# Patient Record
Sex: Female | Born: 1970 | Race: Black or African American | Hispanic: No | Marital: Single | Smoking: Never smoker
Health system: Southern US, Community
[De-identification: ages and names within clinical notes are randomized; demographics above are authoritative.]

## PROBLEM LIST (undated history)

## (undated) HISTORY — PX: ANKLE FRACTURE SURGERY: SHX122

## (undated) HISTORY — PX: TUBAL LIGATION: SHX77

## (undated) HISTORY — PX: CHOLECYSTECTOMY: SHX55

---

## 2016-01-24 ENCOUNTER — Encounter (HOSPITAL_BASED_OUTPATIENT_CLINIC_OR_DEPARTMENT_OTHER): Payer: Self-pay | Admitting: *Deleted

## 2016-01-24 ENCOUNTER — Emergency Department (HOSPITAL_BASED_OUTPATIENT_CLINIC_OR_DEPARTMENT_OTHER)
Admission: EM | Admit: 2016-01-24 | Discharge: 2016-01-24 | Disposition: A | Discharge status: Home / Self Care | Payer: 59 | Attending: Emergency Medicine | Admitting: Emergency Medicine

## 2016-01-24 DIAGNOSIS — S3992XA Unspecified injury of lower back, initial encounter: Secondary | ICD-10-CM | POA: Diagnosis present

## 2016-01-24 DIAGNOSIS — S39012A Strain of muscle, fascia and tendon of lower back, initial encounter: Secondary | ICD-10-CM | POA: Diagnosis not present

## 2016-01-24 DIAGNOSIS — W010XXA Fall on same level from slipping, tripping and stumbling without subsequent striking against object, initial encounter: Secondary | ICD-10-CM | POA: Diagnosis not present

## 2016-01-24 DIAGNOSIS — Y999 Unspecified external cause status: Secondary | ICD-10-CM | POA: Diagnosis not present

## 2016-01-24 DIAGNOSIS — Y929 Unspecified place or not applicable: Secondary | ICD-10-CM | POA: Diagnosis not present

## 2016-01-24 DIAGNOSIS — Y939 Activity, unspecified: Secondary | ICD-10-CM | POA: Diagnosis not present

## 2016-01-24 DIAGNOSIS — W19XXXA Unspecified fall, initial encounter: Secondary | ICD-10-CM

## 2016-01-24 MED ORDER — NAPROXEN 500 MG PO TABS: 500.0000 mg | ORAL_TABLET | Freq: Two times a day (BID) | ORAL | Status: DC

## 2016-01-24 MED ORDER — ORPHENADRINE CITRATE ER 100 MG PO TB12: 100.0000 mg | ORAL_TABLET | Freq: Two times a day (BID) | ORAL | Status: DC
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
## 2016-01-24 NOTE — ED Provider Notes (Signed)
CSN: [id number]     Arrival date & time 01/24/16  0748 History   First MD Initiated Contact with Patient 01/24/16 0801     No chief complaint on file.    (Consider location/radiation/quality/duration/timing/severity/associated sxs/prior Treatment) HPI Patient fell at work on Thursday, 3 days ago. She states she slipped and fell backwards landing on her buttocks and then her back. She reports that at that time, the pain was not too severe. She reports however starting yesterday and today her back is much more painful and stiff. Pain is worse with trying to bend forward. Twisting motions also or uncomfortable. Pain is from the lower back in the center and radiating greater to the left but also slightly to the right. No lower extremity pain, weakness or dysfunction. No radiation of pain into the abdomen or lower extremities. No urinary dysfunction. No pain burning or urgency. No gait dysfunction.  No past medical history on file. No past surgical history on file. No family history on file. Social History  Substance Use Topics  . Smoking status: Not on file  . Smokeless tobacco: Not on file  . Alcohol Use: Not on file   OB History    No data available     Review of Systems  10 Systems reviewed and are negative for acute change except as noted in the HPI.   Allergies  Review of patient's allergies indicates not on file.  Home Medications   Prior to Admission medications   Medication Sig Start Date End Date Taking? Authorizing Provider  naproxen (NAPROSYN) 500 MG tablet Take 1 tablet (500 mg total) by mouth 2 (two) times daily. 01/24/16   Charlesetta Shanks, MD  orphenadrine (NORFLEX) 100 MG tablet Take 1 tablet (100 mg total) by mouth 2 (two) times daily. 01/24/16   Charlesetta Shanks, MD   BP 124/81 mmHg  Pulse 52  Temp(Src) 99.1 F (37.3 C) (Oral)  Resp 20  Ht 5\' 6"  (1.676 m)  Wt 181 lb (82.101 kg)  BMI 29.23 kg/m2  SpO2 100% Physical Exam  Constitutional: She is oriented to person,  place, and time. She appears well-developed and well-nourished.  HENT:  Head: Normocephalic and atraumatic.  Eyes: EOM are normal. Pupils are equal, round, and reactive to light.  Neck: Neck supple.  Cardiovascular: Normal rate, regular rhythm, normal heart sounds and intact distal pulses.   Pulmonary/Chest: Effort normal and breath sounds normal.  Abdominal: Soft. Bowel sounds are normal. She exhibits no distension. There is no tenderness.  Musculoskeletal: Normal range of motion. She exhibits no edema.  Tenderness to palpation of the lumbar spine from approximately L3 to the sacrum. Palpable deformity. Palpation tenderness also to the left SI region and paraspinous muscle bodies. Normal lower extremity examination. Strength testing 5 out of 5 flexion and extension at the ankles. Patient is able to spontaneously elevate each lower shot off the bed and hold. The station intact to light touch.  Neurological: She is alert and oriented to person, place, and time. She has normal strength. She exhibits normal muscle tone. Coordination normal. GCS eye subscore is 4. GCS verbal subscore is 5. GCS motor subscore is 6.  Skin: Skin is warm, dry and intact.  Psychiatric: She has a normal mood and affect.    ED Course  Procedures (including critical care time) Labs Review Labs Reviewed - No data to display  Imaging Review No results found. I have personally reviewed and evaluated these images and lab results as part of my medical decision-making.  EKG Interpretation None      MDM   Final diagnoses:  Lumbosacral strain, initial encounter  Fall, initial encounter   Findings consistent with lumbosacral strain without neurologic compromise. Patient will be initiated on naproxen and Norflex. She is counseled on follow-up plan with sports medicine.    Charlesetta Shanks, MD 01/24/16 [phone]

## 2016-01-24 NOTE — Discharge Instructions (Signed)

## 2017-09-25 ENCOUNTER — Encounter (HOSPITAL_BASED_OUTPATIENT_CLINIC_OR_DEPARTMENT_OTHER): Payer: Self-pay | Admitting: Emergency Medicine

## 2017-09-25 ENCOUNTER — Emergency Department (HOSPITAL_BASED_OUTPATIENT_CLINIC_OR_DEPARTMENT_OTHER)
Admission: EM | Admit: 2017-09-25 | Discharge: 2017-09-25 | Disposition: A | Discharge status: Home / Self Care | Payer: Self-pay | Attending: Emergency Medicine | Admitting: Emergency Medicine

## 2017-09-25 ENCOUNTER — Emergency Department (HOSPITAL_BASED_OUTPATIENT_CLINIC_OR_DEPARTMENT_OTHER): Payer: Self-pay

## 2017-09-25 ENCOUNTER — Other Ambulatory Visit: Payer: Self-pay

## 2017-09-25 DIAGNOSIS — R911 Solitary pulmonary nodule: Secondary | ICD-10-CM | POA: Insufficient documentation

## 2017-09-25 DIAGNOSIS — R112 Nausea with vomiting, unspecified: Secondary | ICD-10-CM | POA: Insufficient documentation

## 2017-09-25 DIAGNOSIS — Z7722 Contact with and (suspected) exposure to environmental tobacco smoke (acute) (chronic): Secondary | ICD-10-CM | POA: Insufficient documentation

## 2017-09-25 DIAGNOSIS — R059 Cough, unspecified: Secondary | ICD-10-CM

## 2017-09-25 DIAGNOSIS — B349 Viral infection, unspecified: Secondary | ICD-10-CM | POA: Insufficient documentation

## 2017-09-25 DIAGNOSIS — Z79899 Other long term (current) drug therapy: Secondary | ICD-10-CM | POA: Insufficient documentation

## 2017-09-25 DIAGNOSIS — R05 Cough: Secondary | ICD-10-CM

## 2017-09-25 MED ORDER — ONDANSETRON HCL 4 MG PO TABS: 4.0000 mg | ORAL_TABLET | Freq: Three times a day (TID) | ORAL | 0 refills | Status: DC | PRN

## 2017-09-25 MED ORDER — BENZONATATE 100 MG PO CAPS: 100.0000 mg | ORAL_CAPSULE | Freq: Three times a day (TID) | ORAL | 0 refills | Status: DC

## 2017-09-25 MED ORDER — SODIUM CHLORIDE 0.9 % IV BOLUS (SEPSIS)
1000.0000 mL | Freq: Once | INTRAVENOUS | Status: AC
  Administered 2017-09-25: 1000 mL via INTRAVENOUS

## 2017-09-25 MED ORDER — KETOROLAC TROMETHAMINE 30 MG/ML IJ SOLN
30.0000 mg | Freq: Once | INTRAMUSCULAR | Status: AC
  Administered 2017-09-25: 30 mg via INTRAVENOUS
  Filled 2017-09-25: qty 1

## 2017-09-25 MED ORDER — ONDANSETRON HCL 4 MG/2ML IJ SOLN
4.0000 mg | Freq: Once | INTRAMUSCULAR | Status: AC
  Administered 2017-09-25: 4 mg via INTRAVENOUS
  Filled 2017-09-25: qty 2

## 2017-09-25 MED ORDER — IBUPROFEN 600 MG PO TABS: 600.0000 mg | ORAL_TABLET | Freq: Four times a day (QID) | ORAL | 0 refills | Status: DC | PRN

## 2017-09-25 NOTE — ED Triage Notes (Signed)
"   Every joint hurts since 1/18 because all my grand kids are sick with colds"

## 2017-09-25 NOTE — Discharge Instructions (Signed)
You were seen today for upper respiratory symptoms, cough, vomiting.  This is likely viral in nature.  You are not a candidate for Tamiflu.  Make sure you are staying hydrated.  Take ibuprofen as needed for body aches or pains.  Zofran for nausea.  Tessalon Perles for cough.  On your x-ray, you were seen to have a persistent pulmonary nodule.  Given that you are non-smoker, this may be benign.  You need to have an outpatient CT scan to better characterize.

## 2017-09-25 NOTE — ED Notes (Signed)
Given gingerale for fluid challenge  

## 2017-09-25 NOTE — ED Provider Notes (Signed)
[facility] Provider Note   CSN: [id number] Arrival date & time: 09/25/17  0545     History   Chief Complaint Chief Complaint  Patient presents with  . Cough    HPI Gina Mason is a 47 y.o. female.  HPI  This a 47 year old female with no significant past medical history who presents with 1 week history of upper respiratory symptoms.  Patient reports a nonproductive cough.  She reports chills without documented fevers.  She reports generalized myalgias.  Over the last 2 days she has had vomiting.  Denies abdominal pain or chest pain.  Denies significant shortness of breath.  She is a non-smoker.  Her son is sick with similar symptoms.  She did not receive a flu vaccine this year.  History reviewed. No pertinent past medical history.  There are no active problems to display for this patient.   Past Surgical History:  Procedure Laterality Date  . ANKLE FRACTURE SURGERY    . CHOLECYSTECTOMY      OB History    No data available       Home Medications    Prior to Admission medications   Medication Sig Start Date End Date Taking? Authorizing Provider  benzonatate (TESSALON) 100 MG capsule Take 1 capsule (100 mg total) by mouth every 8 (eight) hours. 09/25/17   [name], [name], MD  ibuprofen (ADVIL,MOTRIN) 600 MG tablet Take 1 tablet (600 mg total) by mouth every 6 (six) hours as needed. 09/25/17   [name], [name], MD  naproxen (NAPROSYN) 500 MG tablet Take 1 tablet (500 mg total) by mouth 2 (two) times daily. 01/24/16   Charlesetta Shanks, MD  ondansetron (ZOFRAN) 4 MG tablet Take 1 tablet (4 mg total) by mouth every 8 (eight) hours as needed for nausea or vomiting. 09/25/17   [name], [name], MD  orphenadrine (NORFLEX) 100 MG tablet Take 1 tablet (100 mg total) by mouth 2 (two) times daily. 01/24/16   Charlesetta Shanks, MD    Family History No family history on file.  Social History Social History   Tobacco Use  . Smoking status: Passive  Smoke Exposure - Never Smoker  . Smokeless tobacco: Never Used  Substance Use Topics  . Alcohol use: No    Alcohol/week: 1.2 oz    Types: 2 Cans of beer per week    Frequency: Never  . Drug use: No     Allergies   Patient has no known allergies.   Review of Systems Review of Systems  Constitutional: Positive for chills. Negative for fever.  HENT: Negative for sore throat.   Respiratory: Positive for cough. Negative for shortness of breath.   Cardiovascular: Negative for chest pain.  Gastrointestinal: Positive for nausea and vomiting. Negative for abdominal pain and diarrhea.  Musculoskeletal: Positive for myalgias.  Neurological: Negative for headaches.  All other systems reviewed and are negative.    Physical Exam Updated Vital Signs BP 109/64 (BP Location: Right Arm)   Pulse 68   Temp 98.7 F (37.1 C) (Oral)   Resp 16   Ht 5\' 6"  (1.676 m)   Wt 93 kg (205 lb)   LMP 09/19/2017 (Exact Date)   SpO2 99%   BMI 33.09 kg/m   Physical Exam  Constitutional: She is oriented to person, place, and time. She appears well-developed and well-nourished.  Obese, nontoxic-appearing, no acute distress  HENT:  Head: Normocephalic and atraumatic.  Mouth/Throat: Oropharynx is clear and moist. No oropharyngeal exudate.  Mucous membrane  is dry  Eyes: Pupils are equal, round, and reactive to light.  Cardiovascular: Normal rate, regular rhythm and normal heart sounds.  Pulmonary/Chest: Effort normal. No respiratory distress. She has no wheezes.  Abdominal: Soft. Bowel sounds are normal. She exhibits no mass. There is no tenderness.  Lymphadenopathy:    She has no cervical adenopathy.  Neurological: She is alert and oriented to person, place, and time.  Skin: Skin is warm and dry.  Psychiatric: She has a normal mood and affect.  Nursing note and vitals reviewed.    ED Treatments / Results  Labs (all labs ordered are listed, but only abnormal results are displayed) Labs Reviewed  - No data to display  EKG  EKG Interpretation None       Radiology Dg Chest 2 View  Result Date: 09/25/2017 CLINICAL DATA:  Cough congestion and cold like symptoms. EXAM: CHEST  2 VIEW COMPARISON:  11/04/2009 FINDINGS: Cardiomediastinal silhouette is normal. Mediastinal contours appear intact. There is no evidence of pleural effusion or pneumothorax. There is a persistent 2.1 cm post infectious scarring versus pulmonary nodule in the subpleural left upper lobe, at the site of previously seen airspace consolidation, on chest x-ray dated 11/04/2009. Osseous structures are without acute abnormality. Soft tissues are grossly normal. IMPRESSION: No evidence of lobar pneumonia. Persistent 2.1 cm subpleural scarring versus pulmonary nodule in the left upper lobe. Further evaluation with chest CT with contrast may be considered to further characterize this finding. Electronically Signed   By: Fidela Salisbury M.D.   On: 09/25/2017 07:11    Procedures Procedures (including critical care time)  Medications Ordered in ED Medications  sodium chloride 0.9 % bolus 1,000 mL (1,000 mLs Intravenous New Bag/Given 09/25/17 0726)  ondansetron (ZOFRAN) injection 4 mg (4 mg Intravenous Given 09/25/17 0726)  ketorolac (TORADOL) 30 MG/ML injection 30 mg (30 mg Intravenous Given 09/25/17 0726)     Initial Impression / Assessment and Plan / ED Course  I have reviewed the triage vital signs and the nursing notes.  Pertinent labs & imaging results that were available during my care of the patient were reviewed by me and considered in my medical decision making (see chart for details).     Patient presents with upper respiratory and viral symptoms.  She is overall nontoxic appearing.  Afebrile.  Vital signs reassuring.  Exam is fairly benign.  She does appear a Mcneill bit dry.  Patient was given fluids, Zofran, ibuprofen.  Chest x-ray shows no evidence of pneumonia.  Suspect viral etiology.  Recommend supportive  measures at home.  After history, exam, and medical workup I feel the patient has been appropriately medically screened and is safe for discharge home. Pertinent diagnoses were discussed with the patient. Patient was given return precautions.   Final Clinical Impressions(s) / ED Diagnoses   Final diagnoses:  Viral syndrome  Cough  Non-intractable vomiting with nausea, unspecified vomiting type  Pulmonary nodule    ED Discharge Orders        Ordered    benzonatate (TESSALON) 100 MG capsule  Every 8 hours     09/25/17 0744    ondansetron (ZOFRAN) 4 MG tablet  Every 8 hours PRN     09/25/17 0744    ibuprofen (ADVIL,MOTRIN) 600 MG tablet  Every 6 hours PRN     09/25/17 0744       Merryl Hacker, MD 09/25/17 [phone]

## 2017-10-02 ENCOUNTER — Encounter (HOSPITAL_BASED_OUTPATIENT_CLINIC_OR_DEPARTMENT_OTHER): Payer: Self-pay | Admitting: Emergency Medicine

## 2017-10-02 ENCOUNTER — Other Ambulatory Visit: Payer: Self-pay

## 2017-10-02 ENCOUNTER — Emergency Department (HOSPITAL_BASED_OUTPATIENT_CLINIC_OR_DEPARTMENT_OTHER): Payer: Self-pay

## 2017-10-02 ENCOUNTER — Emergency Department (HOSPITAL_BASED_OUTPATIENT_CLINIC_OR_DEPARTMENT_OTHER)
Admission: EM | Admit: 2017-10-02 | Discharge: 2017-10-02 | Disposition: A | Discharge status: Home / Self Care | Payer: Self-pay | Attending: Emergency Medicine | Admitting: Emergency Medicine

## 2017-10-02 DIAGNOSIS — R69 Illness, unspecified: Secondary | ICD-10-CM

## 2017-10-02 DIAGNOSIS — Z7722 Contact with and (suspected) exposure to environmental tobacco smoke (acute) (chronic): Secondary | ICD-10-CM | POA: Insufficient documentation

## 2017-10-02 DIAGNOSIS — J111 Influenza due to unidentified influenza virus with other respiratory manifestations: Secondary | ICD-10-CM | POA: Insufficient documentation

## 2017-10-02 DIAGNOSIS — Z79899 Other long term (current) drug therapy: Secondary | ICD-10-CM | POA: Insufficient documentation

## 2017-10-02 MED ORDER — ACETAMINOPHEN 500 MG PO TABS
1000.0000 mg | ORAL_TABLET | Freq: Once | ORAL | Status: AC
  Administered 2017-10-02: 1000 mg via ORAL
  Filled 2017-10-02: qty 2

## 2017-10-02 NOTE — ED Notes (Signed)
Patient transported to X-ray 

## 2017-10-02 NOTE — ED Provider Notes (Signed)
[facility] Provider Note   CSN: [id number] Arrival date & time: 10/02/17  1015     History   Chief Complaint Chief Complaint  Patient presents with  . Headache  . Cough    HPI Gina Mason is a 47 y.o. female.  Complains of headache and cough for 1 week.  She has had a few episodes of posttussive vomiting.  She also reports chest pain which started while waiting here, points to a 2 cm area at subxiphoid area which is nonradiating which is not made better or worse by anything.  Associated symptoms include up a few episodes of posttussive vomiting.  She is was seen here on September 25, 2017 for same complaint diagnosed with viral syndrome prescribed ibuprofen, Zofran and Tessalon  which she is taken without relief.  Eyes fever.  HPI  History reviewed. No pertinent past medical history. Past medical history negative There are no active problems to display for this patient.   Past Surgical History:  Procedure Laterality Date  . ANKLE FRACTURE SURGERY    . CHOLECYSTECTOMY      OB History    No data available       Home Medications    Prior to Admission medications   Medication Sig Start Date End Date Taking? Authorizing Provider  benzonatate (TESSALON) 100 MG capsule Take 1 capsule (100 mg total) by mouth every 8 (eight) hours. 09/25/17  Yes Horton, [name], MD  ondansetron (ZOFRAN) 4 MG tablet Take 1 tablet (4 mg total) by mouth every 8 (eight) hours as needed for nausea or vomiting. 09/25/17  Yes Horton, [name], MD  ibuprofen (ADVIL,MOTRIN) 600 MG tablet Take 1 tablet (600 mg total) by mouth every 6 (six) hours as needed. 09/25/17   Horton, [name], MD  naproxen (NAPROSYN) 500 MG tablet Take 1 tablet (500 mg total) by mouth 2 (two) times daily. 01/24/16   Charlesetta Shanks, MD  orphenadrine (NORFLEX) 100 MG tablet Take 1 tablet (100 mg total) by mouth 2 (two) times daily. 01/24/16   Charlesetta Shanks, MD    Family History No family history on  file. No family history of coronary disease Social History Social History   Tobacco Use  . Smoking status: Passive Smoke Exposure - Never Smoker  . Smokeless tobacco: Never Used  Substance Use Topics  . Alcohol use: No    Alcohol/week: 1.2 oz    Types: 2 Cans of beer per week    Frequency: Never  . Drug use: No     Allergies   Patient has no known allergies.   Review of Systems Review of Systems  Constitutional: Negative.   HENT: Negative.   Respiratory: Negative.   Cardiovascular: Positive for chest pain.  Gastrointestinal: Positive for vomiting.       Posttussive vomiting  Musculoskeletal: Negative.   Skin: Negative.   Neurological: Positive for headaches.  Psychiatric/Behavioral: Negative.   All other systems reviewed and are negative.    Physical Exam Updated Vital Signs BP 122/90   Pulse 60   Temp 98.1 F (36.7 C) (Oral)   Resp 18   Ht 5\' 6"  (1.676 m)   Wt 91.6 kg (202 lb)   LMP 09/19/2017 (Exact Date)   SpO2 100%   BMI 32.60 kg/m   Physical Exam  Constitutional: She appears well-developed and well-nourished.  HENT:  Head: Normocephalic and atraumatic.  Eyes: Conjunctivae are normal. Pupils are equal, round, and reactive to light.  Neck: Neck supple. No  tracheal deviation present. No thyromegaly present.  Cardiovascular: Normal rate, regular rhythm and normal heart sounds.  No murmur heard. Pulmonary/Chest: Effort normal and breath sounds normal.  Abdominal: Soft. Bowel sounds are normal. She exhibits no distension. There is no tenderness.  Musculoskeletal: Normal range of motion. She exhibits no edema or tenderness.  Neurological: She is alert. Coordination normal.  Skin: Skin is warm and dry. No rash noted.  Psychiatric: She has a normal mood and affect.  Nursing note and vitals reviewed.    ED Treatments / Results  Labs (all labs ordered are listed, but only abnormal results are displayed) Labs Reviewed - No data to display  EKG  EKG  Interpretation  Date/Time:  Sunday October 02 2017 13:06:24 EST Ventricular Rate:  53 PR Interval:    QRS Duration: 114 QT Interval:  436 QTC Calculation: 410 R Axis:   101 Text Interpretation:  Sinus rhythm IRBBB and LPFB Low voltage, precordial leads No old tracing to compare Confirmed by Orlie Dakin [phone]) on 10/02/2017 1:16:10 PM Also confirmed by Orlie Dakin [phone]), editor Philomena Doheny [phone])  on 10/02/2017 1:26:36 PM       Radiology No results found. Chest x-ray viewed by me No results found for this or any previous visit. Dg Chest 2 View  Result Date: 10/02/2017 CLINICAL DATA:  Cough for 2 weeks. EXAM: CHEST  2 VIEW COMPARISON:  09/25/2017 and prior exam FINDINGS: Mild cardiomegaly and peribronchial thickening again noted. Left apical scarring is again noted. There is no evidence of focal airspace disease, pulmonary edema, suspicious pulmonary nodule/mass, pleural effusion, or pneumothorax. No acute bony abnormalities are identified. IMPRESSION: No evidence of acute cardiopulmonary disease. Electronically Signed   By: Margarette Canada M.D.   On: 10/02/2017 13:16   Dg Chest 2 View  Result Date: 09/25/2017 CLINICAL DATA:  Cough congestion and cold like symptoms. EXAM: CHEST  2 VIEW COMPARISON:  11/04/2009 FINDINGS: Cardiomediastinal silhouette is normal. Mediastinal contours appear intact. There is no evidence of pleural effusion or pneumothorax. There is a persistent 2.1 cm post infectious scarring versus pulmonary nodule in the subpleural left upper lobe, at the site of previously seen airspace consolidation, on chest x-ray dated 11/04/2009. Osseous structures are without acute abnormality. Soft tissues are grossly normal. IMPRESSION: No evidence of lobar pneumonia. Persistent 2.1 cm subpleural scarring versus pulmonary nodule in the left upper lobe. Further evaluation with chest CT with contrast may be considered to further characterize this finding. Electronically Signed   By:  Fidela Salisbury M.D.   On: 09/25/2017 07:11   Procedures Procedures (including critical care time)  Medications Ordered in ED Medications  acetaminophen (TYLENOL) tablet 1,000 mg (not administered)     Initial Impression / Assessment and Plan / ED Course  I have reviewed the triage vital signs and the nursing notes.  Pertinent labs & imaging results that were available during my care of the patient were reviewed by me and considered in my medical decision making (see chart for details).     1:30 PM patient is chest pain-free.  Feels improved after treatment with Tylenol. On further history she reports similar chest pain to today's at subxiphoid area intermittently for a year.  Pain is nonexertional.  I do not feel she needs further ED workup for this atypical sounding chest pain.  She has no risk factors for coronary disease she does need referral to primary care.   Plan Tylenol encourage oral hydration.  Referral primary care Final Clinical Impressions(s) / ED  Diagnoses  Diagnoses #1 influenza-like illness #2 atypical chest pain #3 headache Final diagnoses:  None    ED Discharge Orders    None       Orlie Dakin, MD 10/02/17 1339

## 2017-10-02 NOTE — ED Notes (Signed)
Pt on monitor 

## 2017-10-02 NOTE — Discharge Instructions (Signed)
Take Tylenol every 4 hours as needed for aches.Make sure that you drink at least six 8 ounce glasses of water or Gatorade each day in order to stay well-hydrated.  Call the number on these instructions tomorrow to arrange to get a primary care physician.  You may need further investigation of your chest pain as an outpatient.  Your EKG(heart tracing) was slightly abnormal today, but not suggestive of a heart attack.  Return if concern for any reason

## 2017-10-02 NOTE — ED Triage Notes (Signed)
Pt reports headache x 1 week. Pt has been coughing x 4 days and has pain in her chest when she coughs.

## 2019-04-28 ENCOUNTER — Other Ambulatory Visit: Payer: Self-pay

## 2019-04-28 ENCOUNTER — Encounter (HOSPITAL_BASED_OUTPATIENT_CLINIC_OR_DEPARTMENT_OTHER): Payer: Self-pay | Admitting: Emergency Medicine

## 2019-04-28 ENCOUNTER — Emergency Department (HOSPITAL_BASED_OUTPATIENT_CLINIC_OR_DEPARTMENT_OTHER)
Admission: EM | Admit: 2019-04-28 | Discharge: 2019-04-28 | Disposition: A | Discharge status: Home / Self Care | Payer: Self-pay | Attending: Emergency Medicine | Admitting: Emergency Medicine

## 2019-04-28 DIAGNOSIS — R102 Pelvic and perineal pain: Secondary | ICD-10-CM | POA: Insufficient documentation

## 2019-04-28 DIAGNOSIS — Z79899 Other long term (current) drug therapy: Secondary | ICD-10-CM | POA: Insufficient documentation

## 2019-04-28 DIAGNOSIS — Z7722 Contact with and (suspected) exposure to environmental tobacco smoke (acute) (chronic): Secondary | ICD-10-CM | POA: Insufficient documentation

## 2019-04-28 LAB — CBC WITH DIFFERENTIAL/PLATELET
Abs Immature Granulocytes: 0.01 10*3/uL (ref 0.00–0.07)
Basophils Absolute: 0 10*3/uL (ref 0.0–0.1)
Basophils Relative: 0 %
Eosinophils Absolute: 0.3 10*3/uL (ref 0.0–0.5)
Eosinophils Relative: 4 %
HCT: 47.7 % — ABNORMAL HIGH (ref 36.0–46.0)
Hemoglobin: 15.3 g/dL — ABNORMAL HIGH (ref 12.0–15.0)
Immature Granulocytes: 0 %
Lymphocytes Relative: 27 %
Lymphs Abs: 1.9 10*3/uL (ref 0.7–4.0)
MCH: 28.8 pg (ref 26.0–34.0)
MCHC: 32.1 g/dL (ref 30.0–36.0)
MCV: 89.7 fL (ref 80.0–100.0)
Monocytes Absolute: 0.5 10*3/uL (ref 0.1–1.0)
Monocytes Relative: 7 %
Neutro Abs: 4.4 10*3/uL (ref 1.7–7.7)
Neutrophils Relative %: 62 %
Platelets: 167 10*3/uL (ref 150–400)
RBC: 5.32 MIL/uL — ABNORMAL HIGH (ref 3.87–5.11)
RDW: 12.6 % (ref 11.5–15.5)
WBC: 7.1 10*3/uL (ref 4.0–10.5)
nRBC: 0 % (ref 0.0–0.2)

## 2019-04-28 LAB — URINALYSIS, MICROSCOPIC (REFLEX)

## 2019-04-28 LAB — COMPREHENSIVE METABOLIC PANEL
ALT: 26 U/L (ref 0–44)
AST: 23 U/L (ref 15–41)
Albumin: 4 g/dL (ref 3.5–5.0)
Alkaline Phosphatase: 77 U/L (ref 38–126)
Anion gap: 11 (ref 5–15)
BUN: 10 mg/dL (ref 6–20)
CO2: 25 mmol/L (ref 22–32)
Calcium: 9.1 mg/dL (ref 8.9–10.3)
Chloride: 105 mmol/L (ref 98–111)
Creatinine, Ser: 0.7 mg/dL (ref 0.44–1.00)
GFR calc Af Amer: >60 mL/min (ref >60)
GFR calc non Af Amer: >60 mL/min (ref >60)
Glucose, Bld: 105 mg/dL — ABNORMAL HIGH (ref 70–99)
Potassium: 3.9 mmol/L (ref 3.5–5.1)
Sodium: 141 mmol/L (ref 135–145)
Total Bilirubin: 0.8 mg/dL (ref 0.3–1.2)
Total Protein: 7.7 g/dL (ref 6.5–8.1)

## 2019-04-28 LAB — URINALYSIS, ROUTINE W REFLEX MICROSCOPIC
Bilirubin Urine: NEGATIVE
Glucose, UA: NEGATIVE mg/dL
Ketones, ur: NEGATIVE mg/dL
Leukocytes,Ua: NEGATIVE
Nitrite: NEGATIVE
Protein, ur: NEGATIVE mg/dL
Specific Gravity, Urine: >1.03 — ABNORMAL HIGH (ref 1.005–1.030)
pH: 5.5 (ref 5.0–8.0)

## 2019-04-28 LAB — PREGNANCY, URINE: Preg Test, Ur: NEGATIVE

## 2019-04-28 MED ORDER — CARISOPRODOL 350 MG PO TABS: 350.0000 mg | ORAL_TABLET | Freq: Three times a day (TID) | ORAL | 0 refills | Status: AC

## 2019-04-28 MED ORDER — IBUPROFEN 600 MG PO TABS: 600.0000 mg | ORAL_TABLET | Freq: Four times a day (QID) | ORAL | 0 refills | Status: DC | PRN

## 2019-04-28 NOTE — ED Triage Notes (Signed)
Ongoing lower abd pain for several weeks. Dx with uterine fibroids. She states she can't find anyone to do surgery.

## 2019-04-28 NOTE — ED Provider Notes (Signed)
[facility] Provider Note   CSN: [id number] Arrival date & time: 04/28/19  [id number]     History   Chief Complaint Chief Complaint  Patient presents with  . Abdominal Pain    HPI Gina Mason is a 48 y.o. female.     HPI Patient states she has known multiple uterine fibroids.  She has irregular periods that are frequently heavy.  Just chronic pelvic pain associated with this.  She is having difficulty being seen by a OB/GYN for possible hysterectomy.  She is currently applying for Medicaid.  She denies any fever or chills.  No nausea or vomiting.  Normal bowel movements.  Denies urinary symptoms.  Complains of chronic ongoing pain.  No current vaginal bleeding.   History reviewed. No pertinent past medical history.  There are no active problems to display for this patient.   Past Surgical History:  Procedure Laterality Date  . ANKLE FRACTURE SURGERY    . CHOLECYSTECTOMY    . TUBAL LIGATION       OB History   No obstetric history on file.      Home Medications    Prior to Admission medications   Medication Sig Start Date End Date Taking? Authorizing Provider  benzonatate (TESSALON) 100 MG capsule Take 1 capsule (100 mg total) by mouth every 8 (eight) hours. 09/25/17   Horton, [name], MD  carisoprodol (SOMA) 350 MG tablet Take 1 tablet (350 mg total) by mouth 3 (three) times daily. 04/28/19   Julianne Rice, MD  ibuprofen (ADVIL) 600 MG tablet Take 1 tablet (600 mg total) by mouth every 6 (six) hours as needed for moderate pain. 04/28/19   Julianne Rice, MD  naproxen (NAPROSYN) 500 MG tablet Take 1 tablet (500 mg total) by mouth 2 (two) times daily. 01/24/16   Charlesetta Shanks, MD  ondansetron (ZOFRAN) 4 MG tablet Take 1 tablet (4 mg total) by mouth every 8 (eight) hours as needed for nausea or vomiting. 09/25/17   Horton, [name], MD  orphenadrine (NORFLEX) 100 MG tablet Take 1 tablet (100 mg total) by mouth 2 (two) times daily. 01/24/16    Charlesetta Shanks, MD    Family History No family history on file.  Social History Social History   Tobacco Use  . Smoking status: Passive Smoke Exposure - Never Smoker  . Smokeless tobacco: Never Used  Substance Use Topics  . Alcohol use: Yes    Alcohol/week: 2.0 standard drinks    Types: 2 Cans of beer per week    Frequency: Never  . Drug use: No     Allergies   Patient has no known allergies.   Review of Systems Review of Systems  Constitutional: Positive for fatigue. Negative for chills and fever.  HENT: Negative for trouble swallowing.   Eyes: Negative for visual disturbance.  Respiratory: Negative for cough.   Cardiovascular: Negative for chest pain.  Gastrointestinal: Positive for abdominal pain. Negative for constipation, diarrhea and nausea.  Genitourinary: Positive for pelvic pain and vaginal bleeding. Negative for difficulty urinating, dysuria, flank pain, frequency and vaginal discharge.  Musculoskeletal: Negative for back pain, myalgias and neck pain.  Skin: Negative for rash and wound.  Neurological: Negative for dizziness, syncope, weakness, light-headedness, numbness and headaches.  All other systems reviewed and are negative.    Physical Exam Updated Vital Signs BP 121/69 (BP Location: Right Arm)   Pulse (!) 49   Temp 98.3 F (36.8 C) (Oral)   Resp 16   Ht  5\' 5"  (1.651 m)   Wt 102.5 kg   LMP 04/09/2019   SpO2 100%   BMI 37.61 kg/m   Physical Exam Vitals signs and nursing note reviewed.  Constitutional:      Appearance: Normal appearance. She is well-developed.  HENT:     Head: Normocephalic and atraumatic.     Nose: Nose normal.     Mouth/Throat:     Mouth: Mucous membranes are moist.  Eyes:     Pupils: Pupils are equal, round, and reactive to light.  Neck:     Musculoskeletal: Normal range of motion and neck supple. No neck rigidity or muscular tenderness.  Cardiovascular:     Rate and Rhythm: Normal rate and regular rhythm.      Heart sounds: No murmur. No friction rub. No gallop.   Pulmonary:     Effort: Pulmonary effort is normal. No respiratory distress.     Breath sounds: Normal breath sounds. No stridor. No wheezing, rhonchi or rales.  Chest:     Chest wall: No tenderness.  Abdominal:     General: Bowel sounds are normal.     Palpations: Abdomen is soft.     Tenderness: There is abdominal tenderness. There is no right CVA tenderness, left CVA tenderness, guarding or rebound.     Hernia: A hernia is present.     Comments: Supraumbilical hernia without obvious warmth.  Unable to reduce.  Patient has suprapubic fullness and tenderness to palpation.  No rebound or guarding.  Musculoskeletal: Normal range of motion.        General: No swelling, tenderness, deformity or signs of injury.     Right lower leg: No edema.     Left lower leg: No edema.  Lymphadenopathy:     Cervical: No cervical adenopathy.  Skin:    General: Skin is warm and dry.     Findings: No erythema or rash.  Neurological:     General: No focal deficit present.     Mental Status: She is alert and oriented to person, place, and time.  Psychiatric:        Behavior: Behavior normal.      ED Treatments / Results  Labs (all labs ordered are listed, but only abnormal results are displayed) Labs Reviewed  CBC WITH DIFFERENTIAL/PLATELET - Abnormal; Notable for the following components:      Result Value   RBC 5.32 (*)    Hemoglobin 15.3 (*)    HCT 47.7 (*)    All other components within normal limits  COMPREHENSIVE METABOLIC PANEL - Abnormal; Notable for the following components:   Glucose, Bld 105 (*)    All other components within normal limits  URINALYSIS, ROUTINE W REFLEX MICROSCOPIC - Abnormal; Notable for the following components:   APPearance CLOUDY (*)    Specific Gravity, Urine >1.030 (*)    Hgb urine dipstick SMALL (*)    All other components within normal limits  URINALYSIS, MICROSCOPIC (REFLEX) - Abnormal; Notable for the  following components:   Bacteria, UA MANY (*)    All other components within normal limits  PREGNANCY, URINE    EKG None  Radiology No results found.  Procedures Procedures (including critical care time)  Medications Ordered in ED Medications - No data to display   Initial Impression / Assessment and Plan / ED Course  I have reviewed the triage vital signs and the nursing notes.  Pertinent labs & imaging results that were available during my care of the patient were  reviewed by me and considered in my medical decision making (see chart for details).       Patient is comfortable appearing.  Laboratory work-up without concerning findings.  No evidence of anemia.  Will refill her Soma and ibuprofen and have follow-up with OB/GYN.  Final Clinical Impressions(s) / ED Diagnoses   Final diagnoses:  Pelvic pain in female    ED Discharge Orders         Ordered    carisoprodol (SOMA) 350 MG tablet  3 times daily     04/28/19 1326    ibuprofen (ADVIL) 600 MG tablet  Every 6 hours PRN     04/28/19 1326           Julianne Rice, MD 04/28/19 1327

## 2019-06-08 ENCOUNTER — Encounter: Payer: Self-pay | Admitting: Obstetrics & Gynecology

## 2019-07-11 ENCOUNTER — Telehealth: Payer: Self-pay | Admitting: Obstetrics and Gynecology

## 2019-07-11 NOTE — Telephone Encounter (Signed)
Attempted to call patient x2 about her appointment on 11/19 @ 8:55. Number rang busy as soon as it was dialed.

## 2019-07-12 ENCOUNTER — Encounter: Payer: Self-pay | Admitting: Obstetrics and Gynecology

## 2019-08-27 ENCOUNTER — Encounter: Payer: Self-pay | Admitting: Obstetrics & Gynecology

## 2019-08-27 ENCOUNTER — Ambulatory Visit (INDEPENDENT_AMBULATORY_CARE_PROVIDER_SITE_OTHER): Payer: Self-pay | Admitting: Obstetrics & Gynecology

## 2019-08-27 ENCOUNTER — Ambulatory Visit (INDEPENDENT_AMBULATORY_CARE_PROVIDER_SITE_OTHER): Payer: Self-pay | Admitting: Clinical

## 2019-08-27 ENCOUNTER — Other Ambulatory Visit: Payer: Self-pay

## 2019-08-27 VITALS — BP 129/73 | HR 52 | Ht 65.0 in | Wt 219.3 lb

## 2019-08-27 DIAGNOSIS — F32A Depression, unspecified: Secondary | ICD-10-CM

## 2019-08-27 DIAGNOSIS — D259 Leiomyoma of uterus, unspecified: Secondary | ICD-10-CM | POA: Insufficient documentation

## 2019-08-27 DIAGNOSIS — F329 Major depressive disorder, single episode, unspecified: Secondary | ICD-10-CM

## 2019-08-27 DIAGNOSIS — F4323 Adjustment disorder with mixed anxiety and depressed mood: Secondary | ICD-10-CM

## 2019-08-27 LAB — POCT URINALYSIS DIP (DEVICE)
Glucose, UA: NEGATIVE mg/dL
Hgb urine dipstick: NEGATIVE
Ketones, ur: NEGATIVE mg/dL
Leukocytes,Ua: NEGATIVE
Nitrite: NEGATIVE
Protein, ur: NEGATIVE mg/dL
Specific Gravity, Urine: 1.025 (ref 1.005–1.030)
Urobilinogen, UA: 0.2 mg/dL (ref 0.0–1.0)
pH: 6.5 (ref 5.0–8.0)

## 2019-08-27 NOTE — Progress Notes (Signed)
Patient ID: Gina Mason, female   DOB: 1970/11/22, 49 y.o.   MRN: [id number]  Chief Complaint  Patient presents with  . Consult  fibroid uterus  HPI Gina Mason is a 49 y.o. female.  No obstetric history on file. Patient's last menstrual period was 04/09/2019. She is referred to evaluate fibroid uterus and management. She needs financial assistance and a pap HPI  No past medical history on file.  Past Surgical History:  Procedure Laterality Date  . ANKLE FRACTURE SURGERY    . CHOLECYSTECTOMY    . TUBAL LIGATION      No family history on file.  Social History Social History   Tobacco Use  . Smoking status: Passive Smoke Exposure - Never Smoker  . Smokeless tobacco: Never Used  Substance Use Topics  . Alcohol use: Yes    Alcohol/week: 2.0 standard drinks    Types: 2 Cans of beer per week  . Drug use: No    No Known Allergies  Current Outpatient Medications  Medication Sig Dispense Refill  . carisoprodol (SOMA) 350 MG tablet Take 1 tablet (350 mg total) by mouth 3 (three) times daily. 30 tablet 0  . ibuprofen (ADVIL) 600 MG tablet Take 1 tablet (600 mg total) by mouth every 6 (six) hours as needed for moderate pain. 30 tablet 0  . benzonatate (TESSALON) 100 MG capsule Take 1 capsule (100 mg total) by mouth every 8 (eight) hours. (Patient not taking: Reported on 08/27/2019) 21 capsule 0  . naproxen (NAPROSYN) 500 MG tablet Take 1 tablet (500 mg total) by mouth 2 (two) times daily. (Patient not taking: Reported on 08/27/2019) 30 tablet 0  . ondansetron (ZOFRAN) 4 MG tablet Take 1 tablet (4 mg total) by mouth every 8 (eight) hours as needed for nausea or vomiting. (Patient not taking: Reported on 08/27/2019) 20 tablet 0  . orphenadrine (NORFLEX) 100 MG tablet Take 1 tablet (100 mg total) by mouth 2 (two) times daily. (Patient not taking: Reported on 08/27/2019) 30 tablet 0   No current facility-administered medications for this visit.    Review of Systems Review of Systems   Constitutional: Negative.   Respiratory: Negative.   Gastrointestinal: Negative.   Genitourinary: Positive for dysuria, frequency and pelvic pain (occasional sharp low abdominal). Negative for menstrual problem and vaginal discharge.    Blood pressure 129/73, pulse (!) 52, height 5\' 5"  (1.651 m), weight 219 lb 4.8 oz (99.5 kg), last menstrual period 04/09/2019.  Physical Exam Physical Exam Vitals and nursing note reviewed.  Constitutional:      Appearance: Normal appearance. She is obese. She is not ill-appearing.  Pulmonary:     Effort: Pulmonary effort is normal.  Abdominal:     Palpations: Abdomen is soft. There is no mass.  Neurological:     General: No focal deficit present.     Mental Status: She is alert.  Psychiatric:        Mood and Affect: Mood normal.     Data Reviewed CLINICAL DATA: Uterine fibroids suspected, pain since 02/19/2019  EXAM: TRANSABDOMINAL AND TRANSVAGINAL ULTRASOUND OF PELVIS  DOPPLER ULTRASOUND OF OVARIES  TECHNIQUE: Both transabdominal and transvaginal ultrasound examinations of the pelvis were performed. Transabdominal technique was performed for global imaging of the pelvis including uterus, ovaries, adnexal regions, and pelvic cul-de-sac.  It was necessary to proceed with endovaginal exam following the transabdominal exam to visualize the uterus, endometrium, and ovaries. Color and duplex Doppler ultrasound was utilized to evaluate blood flow to the ovaries.  COMPARISON: CT abdomen pelvis February 19, 2019  FINDINGS: Uterus  Measurements: 11.5 x 6.9 x 8.3 cm = volume: 343.4 mL. Multiple heterogeneous uterine fibroids are visualized. The largest at the uterine fundus measures 5.8 x 6.4 x 6.0 cm. Additional slightly exophytic fibroid along the posterior lower uterine segment measures 2.3 x 2.2 x 2.6 cm. A separate partially exophytic fibroid anteriorly at the lower uterine segment measures 2.2 x 2.7 x 3.3  cm.  Endometrium  Thickness: 15 mm. No focal abnormality visualized.  Right ovary  Measurements: 3.0 x 1.6 x 1.5 cm = volume: 3.8 mL. Normal appearance/no adnexal mass. Pulsed Doppler evaluation of both ovaries demonstrates normal low-resistance arterial and venous waveforms.  Left ovary  Not visualized  Other findings  No abnormal free fluid.  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  Endometrial thickness at the upper limits of normal for the secretory phase, correlate with patient's menstrual status.  The left ovary is not visualized on this examination.  Right ovary is unremarkable.   Electronically Signed  By: Lovena Le M.D.  On: 04/02/2019 14:25  Other Result Information  Interface, Rad Results In - 04/02/2019  2:27 PM EDT CLINICAL DATA:  Uterine fibroids suspected, pain since 02/19/2019  EXAM: TRANSABDOMINAL AND TRANSVAGINAL ULTRASOUND OF PELVIS  DOPPLER ULTRASOUND OF OVARIES  TECHNIQUE: Both transabdominal and transvaginal ultrasound examinations of the pelvis were performed. Transabdominal technique was performed for global imaging of the pelvis including uterus, ovaries, adnexal regions, and pelvic cul-de-sac.  It was necessary to proceed with endovaginal exam following the transabdominal exam to visualize the uterus, endometrium, and ovaries. Color and duplex Doppler ultrasound was utilized to evaluate blood flow to the ovaries.  COMPARISON:  CT abdomen pelvis February 19, 2019  FINDINGS: Uterus  Measurements: 11.5 x 6.9 x 8.3 cm = volume: 343.4 mL. Multiple heterogeneous uterine fibroids are visualized. The largest at the uterine fundus measures 5.8 x 6.4 x 6.0 cm. Additional slightly exophytic fibroid along the posterior lower uterine segment measures 2.3 x 2.2 x 2.6 cm. A separate partially exophytic fibroid anteriorly at the lower  uterine segment measures 2.2 x 2.7 x 3.3 cm.  Endometrium  Thickness: 15 mm.  No focal abnormality visualized.  Right ovary  Measurements: 3.0 x 1.6 x 1.5 cm = volume: 3.8 mL. Normal appearance/no adnexal mass. Pulsed Doppler evaluation of both ovaries demonstrates normal low-resistance arterial and venous waveforms.  Left ovary  Not visualized  Other findings  No abnormal free fluid.  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  Endometrial thickness at the upper limits of normal for the secretory phase, correlate with patient's menstrual status.  The left ovary is not visualized on this examination.  Right ovary is unremarkable.   Electronically Signed   By: Lovena Le M.D.   On: 04/02/2019 14:25  Status Results Details     Assessment Fibroid uterus Perimenopause   Plan Apply for financial assistance Refer to [facility] for pap RTC  1 month    Emeterio Reeve 08/27/2019, 2:35 PM

## 2019-08-27 NOTE — BH Specialist Note (Signed)
Integrated Behavioral Health Initial Visit  MRN: [id number] Name: Gina Mason  Number of [facility] Clinician visits:: 1/6 Session Start time: 3:00  Session End time: 3:20 Total time: 20  Type of Service: [place] Interpretor:No. Interpretor Name and Language: n/a   Warm Hand Off Completed.       SUBJECTIVE: Gina Mason is a 49 y.o. female accompanied by n/a Patient was referred by Emeterio Reeve, MD for positive depression screen. Patient reports the following symptoms/concerns: Pt states her primary concern today is feeling depression, anxiety and stress attributed to loss of friend to covid, another friend hospitalized with covid,  interpersonal conflict with boyfriend and family, transportation difficulty, and being uninsured. Pt's main goal is to find her own housing. Pt denies current SI; admits to "thoughts of death" due to relationship problems; no plan, no intent.  Duration of problem: Ongoing; Severity of problem: severe  OBJECTIVE: Mood: Normal and Affect: Appropriate Risk of harm to self or others: No plan to harm self or others  LIFE CONTEXT: Family and Social: Pt lives with boyfriend and children School/Work: - Self-Care: - Life Changes: Ongoing life stress  GOALS ADDRESSED: Patient will: 1. Reduce symptoms of: anxiety, depression and stress 2. Increase knowledge and/or ability of: stress reduction  3. Demonstrate ability to: Increase healthy adjustment to current life circumstances and Increase adequate support systems for patient/family  INTERVENTIONS: Interventions utilized: Psychoeducation and/or Health Education, Link to [employer] and Safety  Standardized Assessments completed: C-SSRS Short, GAD-7 and PHQ 9  ASSESSMENT: Patient currently experiencing Adjustment disorder with depression and anxiety and Psychosocial stress.   Patient may benefit from psychoeducation and brief therapeutic  interventions regarding coping with symptoms of depression, anxiety, and stress .  PLAN: 1. Follow up with behavioral health clinician on : Two weeks 2. Behavioral recommendations:  -Follow Safety Plan if SI thoughts return -Consider establishing with PCP,as discussed -Consider community resources provided in After Visit Summary (AVS) for housing, transportation -Consider apps as additional self-care, as needed 3. Referral(s): Integrated Orthoptist (In Clinic) and [employer]:  Housing and Transportation 4. "From scale of 1-10, how likely are you to follow plan?": -  Gina Minchew C Carrye Goller, LCSW  PHQ9/GAD7 at 18,15

## 2019-08-27 NOTE — Patient Instructions (Addendum)
[facility] [address] [place],  [place]  [address] [phone]  [facility] [place], [place] and [place],  [place]  [address] [phone]  Behavioral Health Resources:   What if I or someone I know is in crisis?  . If you are thinking about harming yourself or having thoughts of suicide, or if you know someone who is, seek help right away.  . Call your doctor or mental health care provider.  . Call 911 or go to a hospital emergency room to get immediate help, or ask a friend or family member to help you do these things.  . Call the [place] National Suicide Prevention Lifeline's toll-free, 24-hour hotline at 1-800-273-TALK 309-332-2245) or TTY: 1-800-799-4 TTY 901-153-8844) to talk to a trained counselor.  . If you are in crisis, make sure you are not left alone.   . If someone else is in crisis, make sure he or she is not left alone   24 Hour :   [place] National Suicide Hotline: (531)561-8248  Therapeutic Alternative Mobile Crisis: (564)556-4251   [facility]  [address] [place], [place] [address]  [phone] or [phone]  Family Service of the [employer] (Domestic Violence, Rape & Victim Assistance)  8318281223  [facility]  [address], [place]  [address]   [phone] or [phone]   [place]: [phone] (8am-4pm) or [phone] (after hours)           [facility] [place], [address], [place], [place] Fax: [phone] www.NailBuddies.ch  *Interpreters available *Accepts Medicaid, Medicare, uninsured  [facility]   Mon-Fri: 8am-5pm [address], [place],  [place] [phone](phone); [phone]) BloggerCourse.com  *Accepts Medicare  [facility] O[place]er [place], Fri: 8am-4pm [address] [place], [place] (phone); [phone] (fax) TaskTown.es  *[place] Mon-Fri: 9am-5pm  [address] [place], [place] (phone); [phone]  https://www.bond-cox.org/  *Accepts Medicaid  [facility] [address] [place], [place]  SalonLookup.es   Family Services of the [place], 8:30am-12pm/1pm-2:30pm [address] [place], [place] (phone); [phone] (fax) www.fspcares.org  *Accepts Medicaid, sliding-scale*Bilingual services available  [facility] Mon-Fri, 8am-7pm [place], [place]  [phone](phone); [phone]) www.famsolutions.org  *Accepts Medicaid *Bilingual services available  [facility] Mon-Fri: 8am-5pm, Saturday by appointment only [place], [place], [place] (phone); [phone] (fax) www.journeyscounselinggso.com   [facility] [address], [place], [place], [place] www.kellinfoundation.org  *Free & reduced services for uninsured and underinsured individuals *Bilingual services for Spanish-speaking clients 21 and under  [facility] [place], [address] [place], [place]); ([phone]) RunningConvention.de  *Bring your own interpreter at first visit *Accepts Medicare and [facility]  [place] Mon-Fri: 9am-5:30pm [address], [place], [place], [place] (phone), [phone] (fax) After hours crisis line: [phone] www.neuropsychcarecenter.com  *Accepts Medicare and  Medicaid  [facility], 8am-6pm [address], [place], [place] (phone); [phone] (fax) http://presbyteriancounseling.org  *Subsidized costs available  [facility] Mon-Fri: 8am-4pm [address], [place], [place] [phone](phone); [phone]) www.psychotherapeuticservices.com  *Accepts Medicaid  [facility] Same day access hours: Mon-Fri, 8:30-3pm Crisis hours: Mon-Fri, 8am-5pm  [address] [place], [place] Same day access hours: Mon-Fri, 8:30-3pm Crisis hours: Mon-Fri, 8am-8pm [address] [place], [place] (phone); [phone] (fax) www.rhahealthservices.org  *Accepts Medicaid and Medicare  [facility] Mon, [place], Fri: 9am-9pm Tues, Thurs: 9am-6pm [place], [place], [place] (phone); [phone] (fax) https://ringercenter.com  *(Accepts Medicare and Medicaid; payment plans available)*Bilingual services available  [facility] [address], [place], [place] (phone); [phone] (fax) www.santecounseling.[place] [address], [place], [place], [place]  OmahaConnections.com.pt  *Bilingual services available  [facility] (Social and Emotional Learning) Mon-Thurs: 8am-8pm [address] [place], [place], [place] (phone); [phone] (fax) LostMillions.com.pt  *Accepts Medicaid*Bilingual services available  [place] [address], [place], [place], [place] (phone) DeadConnect.com.cy  *Accepts Medicaid *Bilingual services available  [facility] Mon-Fri, 9am-4:45pm [address], [place], [place] (phone); [phone] (fax) http://tlc-counseling.com  *Accepts Medicare  [facility] Mon-Thurs: 8:30-8pm,  Fri: 8:30am-7pm [address] [place], [place] (3rd floor) [phone] (phone); [phone] (fax) VIPinterview.si  *Accepts Medicaid; income-based reduced rates available  [facility] Mon-Fri: 8am-5pm [address] [place], [place], [place] (phone); [phone] (fax) http://www.wrightscareservices.com  *Accepts Medicaid*Bilingual services available  [place], [place], [place], [address] (phone); [phone] (fax) www.youthfocus.org  *Free emergency housing and clinical services for youth in crisis  [facility] ([place])  [address] [place] [phone] www.mhag.org  *Provides direct services to individuals in recovery from mental illness, including support groups, recovery skills classes, and one on one peer support  NAMI [employer] on [place]) [place] helpline: (226)361-7699  https://namiguilford.org  *A community hub for information relating to local resources and services for the friends and families of individuals living alongside a mental health condition, as well as the individuals themselves. Classes and support groups also provided    /Emotional [employer] and Websites Here are a few free apps meant to help you to help yourself.  To find, try searching on the internet to see if the app is offered on Apple/Android devices. If your first choice doesn't come up on your device, the good news is that there are many choices! Play around with different apps to see which ones are helpful to you.    Calm This is an app meant to help increase calm feelings. Includes info, strategies, and tools for tracking your feelings.      Calm Harm  This app is meant to help with self-harm. Provides many 5-minute or 15-min coping strategies for doing instead of hurting yourself.       Motley is a problem-solving tool  to help deal with emotions and cope with stress you encounter wherever you are.      MindShift This app can help people cope with anxiety. Rather than trying to avoid anxiety, you can make an important shift and face it.      MY3  MY3 features a support system, safety plan and resources with the goal of offering a tool to use in a time of need.       My Life My Voice  This mood journal offers a simple solution for tracking your thoughts, feelings and moods. Animated emoticons can help identify your mood.       Relax Melodies Designed to help with sleep, on this app you can mix sounds and meditations for relaxation.  Smiling Mind Smiling Mind is meditation made easy: it's a simple tool that helps put a smile on your mind.        Stop, Breathe & Think  A friendly, simple guide for people through meditations for mindfulness and compassion.  Stop, Breathe and Think Kids Enter your current feelings and choose a "mission" to help you cope. Offers videos for certain moods instead of just sound recordings.       Team Orange The goal of this tool is to help teens change how they think, act, and react. This app helps you focus on your own good feelings and experiences.      The Ashland Box The Ashland Box (VHB) contains simple tools to help patients with coping, relaxation, distraction, and positive thinking.      Housing Resources                    [employer] (serves [place], [place], [place], [place], [place], [place], [place], [place], [place], [place], [place], [place], and [place] counties) [address] [place], [place] [address] [phone] http://dawson-may.com/  **Rental assistance, Home Rehabilitation,Weatherization Assistance Program, Forensic psychologist, Housing Voucher Program   Housing Resources [place] [address] [place], [place]  [address] [phone] www.gha-[place].[facility] [address] Lin Lands[place], [place] [address] [phone] https://manning.com/ **Programs include: Engineer, building services and Housing Counseling, Healthy Doctor, general practice, Homeless Prevention and [place] [address] [place], [place], [place] [address] [phone] www.https://www.farmer-stevens.info/ **housing applications/recertification; tax payment relief/exemption under specific qualifications  [facility] [address] [place], [place] [address] www.onlinegreensboro.com/~maryshouse **transitional housing for women in recovery who have minor children or are pregnant  [place], [place], [place] [address] ArtistMovie.se  **emergency shelter and support services for families facing homelessness  Page [address], [place], [place] [address] [phone] www.youthfocus.org **transitional housing to pregnant women; emergency housing for youth who have run away, are experiencing a family crisis, are victims of abuse or neglect, or are homeless  [facility] [address], [place], [place] [address] [phone] ircgso.org **Drop-in center for people experiencing homelessness; overnight warming center when temperature is 25 degrees or below  Re-Entry Staffing [place], [place], [place] [address] [phone] https://reentrystaffingagency.org/ **help with affordable housing to people experiencing homelessness or unemployment due to incarceration  [facility] [address], [place], [place] [address] [phone] www.greensborourbanministry.org  **emergency and transitional housing, rent/mortgage assistance, utility assistance  [facility] [address] [place], [place] [address] [phone] www.salvationarmyofgreensboro.org **emergency and transitional housing  Habitat for [employer] [place], [place], [place] [address] [phone] Www.habitatgreensboro.[place], [place], [place] [address] [phone] https://chshousing.org **[place] and [facility]  [facility]ants [facility] [address] [place] [address]-E2, [place], [place] [address] [phone] arrivance.com **home buyer education courses, foreclosure prevention  [place], [place], [place] [address] [phone] WirelessNovelties.no **Environmental Exposure Assessment (investigation of homes where either children or pregnant women with a confirmed elevated blood lead level reside)  [facility] [address] [place], [place] [address] [phone] http://www.perez.com/ **Home Expense Assistance/Repairs Program; offers home accessibility updates, such as ramps or bars in the bathroom  Self-Help Credit Union-[place],  [place] [address] [phone] https://www.self-help.org/locations/[place]-branch **Offers credit-building and banking services to people unable to use [facility] [place], [place], [place] [address] [phone] RoulettePays.com.br   [facility] [address] [place], [place] [address] [phone]  ClubMonetize.fr **housing applications/recertification, emergency and transitional housing  Open [employer] of [employer] [address], [place], [place] [address] [phone] www.odm-hp.org  **emergency and permanent housing;  rent/mortgage payment assistance  Habitat for [employer], [place] and [place] [address] [place], [place] [address] [phone] ArchitectReviews.com.au  Family Services of the [place], [place], [place], [place] [address] www.familyservice-piedmont.org **emergency shelter for victims of domestic violence and sexual assault  [facility] [address] [place], [place] [address] [phone] www.senior-resources-guilford.org **Home expense assistance/repairs for older [place] of [place], [place], [place] [address] [phone] NameDisc.is  **May offer help with minor housing repairs  [facility] [address], [place], [place] [address] [phone] **emergency housing for victims of domestic violence  Housing Resources [place], [place], [place] [facility])  [facility] [address], [place], [place] [address] [phone] www.newrha.[facility] [address], [place], [place] [address] ([place] [address] [place], [place], [place] [address] [phone] www.co.rockingham.Riverlea.us **Housing applications/recertification; tax payment relief/exemption w specific qualifications  [place] for Homeless [address] [place], [place] [address] [phone] Http://www.rchelpforhomeless.org/HOME_PAGE.html  HELP, [place] [address] [place], [place] [address] [phone] [place] [phone] Hours of Operation: Monday-Friday, 8:30am-5:00pm http://helpincorporated.Radonna RickerIncludes emergency housing for victims of domestic violence  Housing Resources                    [employer] (serves [place], [place], [place], [place], [place],  [place], [place], [place], [place], [place], [place], [place], and [place] counties) [address], [place], [place] [address] [phone] http://dawson-may.com/  **Rental assistance, Home Rehabilitation,Weatherization Assistance Program, Forensic psychologist, Housing Voucher Program   Housing Resources [place] [address] [place], [place] [address] [phone] www.gha-[place] Center.[facility] [address] Lin [place], [place] [address] [phone] https://manning.com/ **Programs include: Engineer, building services and Housing Counseling, Healthy Doctor, general practice, Homeless Prevention and [place] [address], [place], [place], [place] [address] [phone] www.https://www.farmer-stevens.info/ **housing applications/recertification; tax payment relief/exemption under specific qualifications  [facility] [address], [place], [place] [address] www.onlinegreensboro.com/~maryshouse **transitional housing for women in recovery who have minor children or are pregnant  [place], [place], [place] [address] ArtistMovie.se  **emergency shelter and support services for families facing homelessness  [place] [address], [place], [place] [address] [phone] www.youthfocus.org **transitional housing to pregnant women; emergency housing for youth who have run away, are experiencing a family crisis, are victims of abuse or neglect, or are homeless  [facility] [address], [place], [place] [address] [phone] ircgso.org **Drop-in center for people experiencing homelessness; overnight warming center when temperature is 25 degrees or below  Re-Entry Staffing [place], [place],  [address] [phone] https://reentrystaffingagency.org/ **help with  affordable housing to people experiencing homelessness or unemployment due to incarceration  [facility] [address],  [place], [place] [address] [phone] www.greensborourbanministry.org  **emergency and transitional housing, rent/mortgage assistance, utility assistance  [facility] [address] [place], [place] [address] [phone] www.salvationarmyofgreensboro.org **emergency and transitional housing  Habitat for [employer] [place], [place], [place] [address] [phone] Www.habitatgreensboro.Robert Lee [place], [place], [place] [address] [phone] https://chshousing.org **[place] and [facility]  [facility]ousing Consultants Group [address] [place] 2-E2, [place], [place] [address] [phone] arrivance.com **home buyer education courses, foreclosure prevention  [facility] [address] [place], [place] [address] [phone] WirelessNovelties.no **Environmental Exposure Assessment (investigation of homes where either children or pregnant women with a confirmed elevated blood lead level reside)  [facility] of Vocational Rehabilitation-[place], [place], [place] [address] [phone] http://www.perez.com/ **Home Expense Assistance/Repairs Program; offers home accessibility updates, such as ramps or bars in the bathroom  Self-Help Credit Union[facility] [address] [place], [place] [address] [phone] https://www.self-help.org/locations/[place]-branch **Offers credit-building and banking services to people unable to use [place] of [facility] [place], [place], [place] [address] [phone] RoulettePays.com.br   [facility] [address] [place], [place] [address] [phone]  ClubMonetize.fr **housing applications/recertification, emergency and transitional housing  Open [employer] of [employer] [address] [place], [place] [address] [phone] www.odm-hp.org  **emergency and permanent housing; rent/mortgage payment assistance  Habitat for [employer], [place] and [place] [address] [place], [place] [address] [phone] ArchitectReviews.com.au  Family Services of [facility] [place], [employer] [place], [place], [place] [address] www.familyservice-piedmont.org **emergency shelter for victims of domestic violence and sexual assault  [facility] [address], [place], [place] [address] [phone] www.senior-resources-guilford.org **Home expense assistance/repairs for older [place] of [place], [place], [place] [address] [phone] NameDisc.is  **May offer help with minor housing repairs  [facility] [address] [place], [place] [address] [phone] **emergency housing for victims of domestic violence  Housing Resources [place], [place], [place] [facility])  [facility] [address], [place], [place] [address] [phone] www.newrha.[facility] [address] [place], [place] [address] ([place] [address] [place], [place], [place] [address] [phone] www.co.rockingham.Phillipsburg.us **Housing applications/recertification; tax payment relief/exemption w specific qualifications  [place] for Homeless [address], [place], [place] [address] [phone] Http://www.rchelpforhomeless.org/HOME_PAGE.html  HELP, [place] [address] [place], [place] [address] [phone] [place] [phone] Hours of Operation: Monday-Friday, 8:30am-5:00pm http://helpincorporated.org **Includes emergency housing for victims of domestic violence   Transportation Resources [place] ([place]) [place], [place], [place] [address] https://www.Andrews-.gov/departments/transportation/gdot-divisions/[place]amar[place]-transit-agency-public-transportation-division     . Fixed-route bus services, including regional fare cards for PART, [place], [place], and WSTA buses.  . Reduced fare bus ID's available for Medicaid, Medicare, and "orange card" recipients.  Marland Kitchen SCAT offers curb-to-curb and door-to-door bus services for people with disabilities who are unable to use a fixed-bus route; also offers a shared-ride program.   Helpful tips:  -Routes available online and physical maps available at the main bus hub lobby (each for a specific route) -Smartphone directions often include bus routes (see the "bus" icon, next to the "car" and "  walk" icons) -Routes differ on weekends, evenings and holidays, so plan ahead!  -If you have Medicaid, Medicare, or orange card, plan to obtain a reduced-fare ID to save 50% on rides. Check days and times to obtain an ID, and bring all necessary documents.   [employer] System [place]) [place], [place]. [address], [place], [place] [address] [phone] http://www.smith-bell.org/ **Fixed-bus route services, and demand response bus service for older adults  Department of Social [facility] [address] [place], [place] [address] [phone] www.ProfileWatcher.fi **Medicaid transportation is available to [facility] recipients who need assistance getting to [facility] medical appointments and providers  [facility] [address] [place] [address], [place], [place] [address] www.cjmedicaltransportation.com  ** Offers non-emergency transportation for medical appointments  Wheels [place], [place], [place] [address] [phone] www.wheels4hope.org **REFERRAL NEEDED by specific agencies (see website), after meeting specified criteria only  Mirant for [employer]) [address], [place], [place] [address] [phone]  BikerFestival.is  *Regional fixed-bus routes between counties (example: [place] to [place]) and [employer] of [place], [place], [place] [address] [phone] Http://senior-resources-guilford.org  Production assistant, radio available (call or see website for details)  [place] [address], [place], [place] [address] [phone] www.senioradults.org  **[place], Disability and [place] [address] [place], [place] [address] [phone] www.adtsrc.[place] Department of Health and [facility] [place], [place], [place] [address] [phone]  http://goodwin-walker.biz/  **Transportation expense assistance  Department of [place] [address] [place], [place], [place] [address] [phone] www.co.rockingham.Fredonia.us/pview.aspx?id=[email or web address]&catid=[email or web address]  **Medicaid transportation for recipients who need assistance getting to [facility] medical appointments and providers

## 2019-09-10 ENCOUNTER — Other Ambulatory Visit: Payer: Self-pay

## 2019-09-10 ENCOUNTER — Ambulatory Visit: Payer: Self-pay | Admitting: Clinical

## 2019-09-10 DIAGNOSIS — F4321 Adjustment disorder with depressed mood: Secondary | ICD-10-CM

## 2019-09-10 DIAGNOSIS — Z658 Other specified problems related to psychosocial circumstances: Secondary | ICD-10-CM

## 2019-09-10 NOTE — BH Specialist Note (Signed)
Integrated Behavioral Health via Telemedicine Video Visit  09/10/2019 Janely Rapalo [id number]  Number of [place] visits: 2 Session Start time: 10:25  Session End time: 10:38 Total time: 13  Referring Provider: Emeterio Reeve, MD Type of Visit: Video Patient/Family location: Home [facility] Provider location: [facility] All persons participating in visit: Patient Gina Mason and [place]    Confirmed patient's address: Yes  Confirmed patient's phone number: Yes  Any changes to demographics: No   Confirmed patient's insurance: Yes  Any changes to patient's insurance: No   Discussed confidentiality: At previous visit  I connected with Josy Fluhr  by a video enabled telemedicine application and verified that I am speaking with the correct person using two identifiers.     I discussed the limitations of evaluation and management by telemedicine and the availability of in person appointments.  I discussed that the purpose of this visit is to provide behavioral health care while limiting exposure to the novel coronavirus.   Discussed there is a possibility of technology failure and discussed alternative modes of communication if that failure occurs.  I discussed that engaging in this video visit, they consent to the provision of behavioral healthcare and the services will be billed under their insurance.  Patient and/or legal guardian expressed understanding and consented to video visit: Yes   PRESENTING CONCERNS: Patient and/or family reports the following symptoms/concerns: Pt states her primary concern today is recent loss of a second friend to covid on 1/11, after previous loss of a friend a few months ago; third friend is recovering from covid. Pt is utilizing community resources provided at last visit; feels she has good social support to get her through her losses.  Duration of problem: Recent loss one week; Severity of problem: n/a  STRENGTHS (Protective  Factors/Coping Skills): Good social support, resiliency, and accepting outlook  OALS ADDRESSED: Patient will: 1.  Reduce symptoms of: stress  2.  Increase knowledge and/or ability of: healthy habits  3.  Demonstrate ability to: Increase healthy adjustment to current life circumstances and Begin healthy grieving over loss  INTERVENTIONS: Interventions utilized:  Supportive Counseling and Psychoeducation and/or Health Education Standardized Assessments completed: Not Needed  ASSESSMENT: Patient currently experiencing Grief and Psychosocial stress.   Patient may benefit from continued psychoeducation and brief therapeutic interventions regarding coping with symptoms of grief and life stress .  PLAN: 1. Follow up with behavioral health clinician on : As needed 2. Behavioral recommendations:  -Prioritize healthy sleep and meals during this time, as a form of self-care  -Continue talking to supportive friends daily -Continue following safety plan -Continue calling community resources for help with housing, food, transportation -Continue using apps for additional self-care 3. Referral(s): [place] (In Clinic)  I discussed the assessment and treatment plan with the patient and/or parent/guardian. They were provided an opportunity to ask questions and all were answered. They agreed with the plan and demonstrated an understanding of the instructions.   They were advised to call back or seek an in-person evaluation if the symptoms worsen or if the condition fails to improve as anticipated.  Caroleen Hamman Nathanael Krist

## 2019-09-19 ENCOUNTER — Telehealth: Payer: Self-pay

## 2019-09-19 NOTE — Telephone Encounter (Signed)
Pt called stating that she tested positive for COVID and wants to know what to do?  LM for pt that I am returning her call if she continues to have questions or concerns to please give [place] a call.    Mel Almond, RN 09/19/19

## 2020-12-10 ENCOUNTER — Other Ambulatory Visit: Payer: Self-pay

## 2020-12-10 ENCOUNTER — Encounter (HOSPITAL_BASED_OUTPATIENT_CLINIC_OR_DEPARTMENT_OTHER): Payer: Self-pay | Admitting: *Deleted

## 2020-12-10 ENCOUNTER — Emergency Department (HOSPITAL_BASED_OUTPATIENT_CLINIC_OR_DEPARTMENT_OTHER)
Admission: EM | Admit: 2020-12-10 | Discharge: 2020-12-10 | Disposition: A | Discharge status: Home / Self Care | Payer: Medicaid Other | Attending: Emergency Medicine | Admitting: Emergency Medicine

## 2020-12-10 DIAGNOSIS — Z7722 Contact with and (suspected) exposure to environmental tobacco smoke (acute) (chronic): Secondary | ICD-10-CM | POA: Diagnosis not present

## 2020-12-10 DIAGNOSIS — R0981 Nasal congestion: Secondary | ICD-10-CM | POA: Diagnosis present

## 2020-12-10 DIAGNOSIS — R112 Nausea with vomiting, unspecified: Secondary | ICD-10-CM | POA: Insufficient documentation

## 2020-12-10 DIAGNOSIS — J069 Acute upper respiratory infection, unspecified: Secondary | ICD-10-CM

## 2020-12-10 DIAGNOSIS — R109 Unspecified abdominal pain: Secondary | ICD-10-CM | POA: Diagnosis not present

## 2020-12-10 DIAGNOSIS — Z20822 Contact with and (suspected) exposure to covid-19: Secondary | ICD-10-CM | POA: Insufficient documentation

## 2020-12-10 MED ORDER — PROMETHAZINE HCL 25 MG PO TABS
25.0000 mg | ORAL_TABLET | Freq: Once | ORAL | Status: AC
  Administered 2020-12-10: 25 mg via ORAL
  Filled 2020-12-10: qty 1

## 2020-12-10 MED ORDER — PROMETHAZINE HCL 25 MG PO TABS: 25.0000 mg | ORAL_TABLET | Freq: Four times a day (QID) | ORAL | 0 refills | Status: AC | PRN

## 2020-12-10 NOTE — ED Provider Notes (Signed)
[facility] EMERGENCY DEPARTMENT Provider Note   CSN: [id number] Arrival date & time: 12/10/20  2120     History Chief Complaint  Patient presents with  . URI    Gina Mason is a 50 y.o. female.  The history is provided by the patient and medical records.  URI  Gina Mason is a 50 y.o. female who presents to the Emergency Department complaining of congestion.  She reports three days of nasal congestion, nausea, chest soreness, cough productive of yellow and green sputum, sneezing.    Had mild sore throat.   No fever.  Mild central abdominal discomfort.  Vomited once.  No diarrhea/constipation.  No dysuria.    Has no known medical problems.  She has been taking alkaseltzer cold and flu for her symptoms.    Has been vaccinated and boosted for covid 19.  No known sick contacts.  No known sick contacts.      History reviewed. No pertinent past medical history.  Patient Active Problem List   Diagnosis Date Noted  . Fibroid uterus 08/27/2019    Past Surgical History:  Procedure Laterality Date  . ANKLE FRACTURE SURGERY    . CHOLECYSTECTOMY    . TUBAL LIGATION       OB History   No obstetric history on file.     No family history on file.  Social History   Tobacco Use  . Smoking status: Passive Smoke Exposure - Never Smoker  . Smokeless tobacco: Never Used  Substance Use Topics  . Alcohol use: Yes    Alcohol/week: 2.0 standard drinks    Types: 2 Cans of beer per week  . Drug use: No    Home Medications Prior to Admission medications   Medication Sig Start Date End Date Taking? Authorizing Provider  promethazine (PHENERGAN) 25 MG tablet Take 1 tablet (25 mg total) by mouth every 6 (six) hours as needed for nausea or vomiting. 12/10/20  Yes Quintella Reichert, MD  benzonatate (TESSALON) 100 MG capsule Take 1 capsule (100 mg total) by mouth every 8 (eight) hours. Patient not taking: Reported on 08/27/2019 09/25/17   Horton, [name], MD  carisoprodol  (SOMA) 350 MG tablet Take 1 tablet (350 mg total) by mouth 3 (three) times daily. 04/28/19   Julianne Rice, MD  ibuprofen (ADVIL) 600 MG tablet Take 1 tablet (600 mg total) by mouth every 6 (six) hours as needed for moderate pain. 04/28/19   Julianne Rice, MD  naproxen (NAPROSYN) 500 MG tablet Take 1 tablet (500 mg total) by mouth 2 (two) times daily. Patient not taking: Reported on 08/27/2019 01/24/16   Charlesetta Shanks, MD  ondansetron (ZOFRAN) 4 MG tablet Take 1 tablet (4 mg total) by mouth every 8 (eight) hours as needed for nausea or vomiting. Patient not taking: Reported on 08/27/2019 09/25/17   Horton, [name], MD  orphenadrine (NORFLEX) 100 MG tablet Take 1 tablet (100 mg total) by mouth 2 (two) times daily. Patient not taking: Reported on 08/27/2019 01/24/16   Charlesetta Shanks, MD    Allergies    Patient has no known allergies.  Review of Systems   Review of Systems  All other systems reviewed and are negative.   Physical Exam Updated Vital Signs BP (!) 149/100 (BP Location: Right Arm)   Pulse (!) 59   Temp 98.6 F (37 C) (Oral)   Resp 20   Ht 5\' 6"  (1.676 m)   Wt 106.6 kg   LMP 04/09/2019   SpO2 100%  BMI 37.93 kg/m   Physical Exam Vitals and nursing note reviewed.  Constitutional:      Appearance: She is well-developed.  HENT:     Head: Normocephalic and atraumatic.     Mouth/Throat:     Mouth: Mucous membranes are moist.     Pharynx: No oropharyngeal exudate or posterior oropharyngeal erythema.  Cardiovascular:     Rate and Rhythm: Normal rate and regular rhythm.     Heart sounds: No murmur heard.   Pulmonary:     Effort: Pulmonary effort is normal. No respiratory distress.     Breath sounds: Normal breath sounds.  Abdominal:     Palpations: Abdomen is soft.     Tenderness: There is no abdominal tenderness. There is no guarding or rebound.  Musculoskeletal:        General: No tenderness.  Skin:    General: Skin is warm and dry.  Neurological:     Mental  Status: She is alert and oriented to person, place, and time.  Psychiatric:        Behavior: Behavior normal.     ED Results / Procedures / Treatments   Labs (all labs ordered are listed, but only abnormal results are displayed) Labs Reviewed  SARS CORONAVIRUS 2 (TAT 6-24 HRS)    EKG None  Radiology No results found.  Procedures Procedures   Medications Ordered in ED Medications  promethazine (PHENERGAN) tablet 25 mg (25 mg Oral Given 12/10/20 2149)    ED Course  I have reviewed the triage vital signs and the nursing notes.  Pertinent labs & imaging results that were available during my care of the patient were reviewed by me and considered in my medical decision making (see chart for details).    MDM Rules/Calculators/A&P                         patient here for evaluation of congestion, cough, abdominal discomfort, nausea with one episode of emesis. She is non-toxic appearing on evaluation with no respiratory distress. Abdominal examination is benign and she is well hydrated. No clinical evidence of pneumonia or serious bacterial infection. Discussed with patient suspicion for viral URI. Will send COVID test. Discussed home care for upper respiratory infection with return precautions.  Final Clinical Impression(s) / ED Diagnoses Final diagnoses:  Viral upper respiratory tract infection    Rx / DC Orders ED Discharge Orders         Ordered    promethazine (PHENERGAN) 25 MG tablet  Every 6 hours PRN        12/10/20 2146           Quintella Reichert, MD 12/10/20 2152

## 2020-12-10 NOTE — ED Triage Notes (Signed)
C/o fever , cough, congestion , n/v x 4 days

## 2020-12-11 LAB — SARS CORONAVIRUS 2 (TAT 6-24 HRS): SARS Coronavirus 2: NEGATIVE

## 2021-03-26 ENCOUNTER — Emergency Department (HOSPITAL_BASED_OUTPATIENT_CLINIC_OR_DEPARTMENT_OTHER): Payer: 59

## 2021-03-26 ENCOUNTER — Other Ambulatory Visit (HOSPITAL_BASED_OUTPATIENT_CLINIC_OR_DEPARTMENT_OTHER): Payer: Self-pay

## 2021-03-26 ENCOUNTER — Encounter (HOSPITAL_BASED_OUTPATIENT_CLINIC_OR_DEPARTMENT_OTHER): Payer: Self-pay | Admitting: *Deleted

## 2021-03-26 ENCOUNTER — Emergency Department (HOSPITAL_BASED_OUTPATIENT_CLINIC_OR_DEPARTMENT_OTHER)
Admission: EM | Admit: 2021-03-26 | Discharge: 2021-03-26 | Disposition: A | Discharge status: Home / Self Care | Payer: 59 | Attending: Emergency Medicine | Admitting: Emergency Medicine

## 2021-03-26 ENCOUNTER — Other Ambulatory Visit: Payer: Self-pay

## 2021-03-26 DIAGNOSIS — U071 COVID-19: Secondary | ICD-10-CM | POA: Diagnosis not present

## 2021-03-26 DIAGNOSIS — R059 Cough, unspecified: Secondary | ICD-10-CM | POA: Diagnosis present

## 2021-03-26 DIAGNOSIS — M25552 Pain in left hip: Secondary | ICD-10-CM | POA: Insufficient documentation

## 2021-03-26 LAB — RESP PANEL BY RT-PCR (FLU A&B, COVID) ARPGX2
Influenza A by PCR: NEGATIVE
Influenza B by PCR: NEGATIVE
SARS Coronavirus 2 by RT PCR: POSITIVE — AB

## 2021-03-26 MED ORDER — BENZONATATE 100 MG PO CAPS: 100.0000 mg | ORAL_CAPSULE | Freq: Three times a day (TID) | ORAL | 0 refills | Status: AC | PRN

## 2021-03-26 MED ORDER — LIDOCAINE 4 % EX PTCH
1.0000 | MEDICATED_PATCH | Freq: Two times a day (BID) | CUTANEOUS | 0 refills | Status: AC | PRN
  Filled 2021-03-26: qty 30, 15d supply, fill #0

## 2021-03-26 NOTE — ED Provider Notes (Signed)
[facility] EMERGENCY DEPARTMENT Provider Note   CSN: [id number] Arrival date & time: 03/26/21  1231     History No chief complaint on file.   Gina Mason is a 50 y.o. female with medical history as listed below.  Presents emergency department with a chief complaint of left hip pain and URI symptoms.  Patient states that left hip pain started on Wednesday.  Patient states that pain started after moving from sitting to standing position.  Pain gradually resolves after initial onset.  Patient states that she has reproduction of pain when standing for prolonged periods of time.  Pain is relieved when sitting.  At present patient rates pain 1/10 on the pain scale.  Patient denies any recent falls or injuries.  No erythema, warmth, or swelling to affected area.  Patient endorses URI symptoms starting yesterday.  Patient endorses cough, headache, fatigue, chills, rhinorrhea, sore throat.  Cough is producing clear sputum.  Headache is intermittent.  Headache onset is gradual and gets progressively worse over time.  Headache is located across her forehead.  No associated visual disturbance, numbness, weakness, dysarthria, facial asymmetry, nausea, or vomiting.  Patient has received covid vaccine and booster.  No known sick contacts.    HPI     History reviewed. No pertinent past medical history.  Patient Active Problem List   Diagnosis Date Noted   Fibroid uterus 08/27/2019    Past Surgical History:  Procedure Laterality Date   ANKLE FRACTURE SURGERY     CHOLECYSTECTOMY     TUBAL LIGATION       OB History   No obstetric history on file.     No family history on file.  Social History   Tobacco Use   Smoking status: Never    Passive exposure: Yes   Smokeless tobacco: Never  Vaping Use   Vaping Use: Never used  Substance Use Topics   Alcohol use: Yes    Alcohol/week: 2.0 standard drinks    Types: 2 Cans of beer per week   Drug use: No    Home Medications Prior  to Admission medications   Medication Sig Start Date End Date Taking? Authorizing Provider  gabapentin (NEURONTIN) 300 MG capsule Take I capsule at bedtime 03/23/21  Yes [provider]  benzonatate (TESSALON) 100 MG capsule Take 1 capsule (100 mg total) by mouth every 8 (eight) hours. Patient not taking: Reported on 08/27/2019 09/25/17   Horton, [name], MD  carisoprodol (SOMA) 350 MG tablet Take 1 tablet (350 mg total) by mouth 3 (three) times daily. 04/28/19   Julianne Rice, MD  ibuprofen (ADVIL) 600 MG tablet Take 1 tablet (600 mg total) by mouth every 6 (six) hours as needed for moderate pain. 04/28/19   Julianne Rice, MD  naproxen (NAPROSYN) 500 MG tablet Take 1 tablet (500 mg total) by mouth 2 (two) times daily. Patient not taking: No sig reported 01/24/16   Charlesetta Shanks, MD  ondansetron (ZOFRAN) 4 MG tablet Take 1 tablet (4 mg total) by mouth every 8 (eight) hours as needed for nausea or vomiting. Patient not taking: No sig reported 09/25/17   Horton, [name], MD  orphenadrine (NORFLEX) 100 MG tablet Take 1 tablet (100 mg total) by mouth 2 (two) times daily. Patient not taking: No sig reported 01/24/16   Charlesetta Shanks, MD  promethazine (PHENERGAN) 25 MG tablet Take 1 tablet (25 mg total) by mouth every 6 (six) hours as needed for nausea or vomiting. 12/10/20   Quintella Reichert,  MD    Allergies    Patient has no known allergies.  Review of Systems   Review of Systems  Constitutional:  Positive for chills. Negative for fever.  HENT:  Positive for rhinorrhea and sore throat. Negative for congestion, drooling, trouble swallowing and voice change.   Eyes:  Negative for visual disturbance.  Respiratory:  Positive for cough. Negative for shortness of breath.   Cardiovascular:  Negative for chest pain, palpitations and leg swelling.  Gastrointestinal:  Negative for abdominal pain, diarrhea, nausea and vomiting.  Genitourinary:  Negative for difficulty urinating, dysuria, frequency,  hematuria and urgency.  Musculoskeletal:  Negative for back pain, myalgias and neck pain.  Skin:  Negative for color change and rash.  Neurological:  Positive for headaches. Negative for dizziness, tremors, seizures, syncope, facial asymmetry, speech difficulty, weakness, light-headedness and numbness.  Psychiatric/Behavioral:  Negative for confusion.    Physical Exam Updated Vital Signs BP (!) 142/90 (BP Location: Right Arm)   Pulse (!) 47   Temp 99.1 F (37.3 C) (Oral)   Resp 16   Ht 5' 5.5" (1.664 m)   Wt 97 kg   LMP 04/09/2019   SpO2 96%   BMI 35.05 kg/m   Physical Exam Vitals and nursing note reviewed.  Constitutional:      General: She is not in acute distress.    Appearance: She is not ill-appearing, toxic-appearing or diaphoretic.  HENT:     Head: Normocephalic.  Eyes:     General: No scleral icterus.       Right eye: No discharge.        Left eye: No discharge.  Cardiovascular:     Rate and Rhythm: Normal rate.     Pulses:          Dorsalis pedis pulses are 2+ on the left side.  Pulmonary:     Effort: Pulmonary effort is normal. No tachypnea, bradypnea or respiratory distress.     Breath sounds: Normal breath sounds. No stridor.  Abdominal:     General: There is no distension. There are no signs of injury.     Palpations: There is no mass or pulsatile mass.     Tenderness: There is no abdominal tenderness. There is no guarding or rebound.  Musculoskeletal:     Left upper leg: Normal.     Left knee: No swelling, deformity, effusion, bony tenderness or crepitus. Normal range of motion. No tenderness. Normal alignment.     Left lower leg: Normal.     Left ankle: No swelling, deformity, ecchymosis or lacerations. No tenderness. Normal range of motion.     Left foot: Normal range of motion and normal capillary refill. No swelling, deformity, laceration, tenderness, bony tenderness or crepitus. Normal pulse.     Comments: +5 strength to dorsiflexion and plantar  flexion bilaterally.  Patient able to hold both legs against gravity without difficulty.  Patient able to stand and ambulate without difficulty.  Full range of motion to left hip.  No shortening or rotation to bilateral lower extremities.  Skin:    General: Skin is warm and dry.  Neurological:     General: No focal deficit present.     Mental Status: She is alert.  Psychiatric:        Behavior: Behavior is cooperative.    ED Results / Procedures / Treatments   Labs (all labs ordered are listed, but only abnormal results are displayed) Labs Reviewed  RESP PANEL BY RT-PCR (FLU A&B, COVID) [id number] -  Abnormal; Notable for the following components:      Result Value   SARS Coronavirus 2 by RT PCR POSITIVE (*)    All other components within normal limits    EKG None  Radiology DG Chest Portable 1 View  Result Date: 03/26/2021 CLINICAL DATA:  Cough, left hip pain EXAM: PORTABLE CHEST 1 VIEW COMPARISON:  01/13/2021 FINDINGS: Mild cardiomegaly. Both lungs are clear. The visualized skeletal structures are unremarkable. IMPRESSION: Cardiomegaly without acute abnormality of the lungs in AP portable projection. Electronically Signed   By: Eddie Candle M.D.   On: 03/26/2021 13:44    Procedures Procedures   Medications Ordered in ED Medications - No data to display  ED Course  I have reviewed the triage vital signs and the nursing notes.  Pertinent labs & imaging results that were available during my care of the patient were reviewed by me and considered in my medical decision making (see chart for details).    MDM Rules/Calculators/A&P                           50 year old female presents emerged part with a chief complaint of left hip pain and URI symptoms.  No shortening or rotation to bilateral lower extremities.  Patient has full range of motion to left hip.  No tenderness or bony tenderness.  No swelling, erythema, or warmth.  Low suspicion for septic arthritis, fracture or  dislocation.  Suspect patient injury is musculoskeletal in nature.  Patient presents with URI symptoms.  Patient has been fully vaccinated for COVID-19 and has received booster.  We will test patient for COVID-19 and influenza.    Chest x-ray shows cardiomegaly with both lungs clear.  We will have patient follow-up with primary care provider regarding her cardiomegaly.  Patient positive for COVID-19.  Patient refused antiviral treatment at this time.  Will prescribe patient with Tessalon.  Patient to follow-up with primary care provider if symptoms do not improve.  Discussed results, findings, treatment and follow up. Patient advised of return precautions. Patient verbalized understanding and agreed with plan.  Maralyn Hoogland was evaluated in Emergency Department on 03/26/2021 for the symptoms described in the history of present illness. She was evaluated in the context of the global COVID-19 pandemic, which necessitated consideration that the patient might be at risk for infection with the SARS-CoV-2 virus that causes COVID-19. Institutional protocols and algorithms that pertain to the evaluation of patients at risk for COVID-19 are in a state of rapid change based on information released by regulatory bodies including the CDC and federal and state organizations. These policies and algorithms were followed during the patient's care in the ED.   Final Clinical Impression(s) / ED Diagnoses Final diagnoses:  COVID-19  Hip pain, acute, left    Rx / DC Orders ED Discharge Orders     None        Dyann Ruddle 03/26/21 2315    Charlesetta Shanks, MD 03/27/21 1530

## 2021-03-26 NOTE — ED Triage Notes (Signed)
Left hip pain, headache, nausea, cough and fatigue since yesterday. States "I hope I don't have Covid".

## 2021-03-26 NOTE — Discharge Instructions (Addendum)
You came to the emergency department today with reports of Covid-19 like symptoms.   You tested positive for COVID-19. Please isolate at home for at least 7 days after the day your symptoms initially began, and THEN at least 24 hours after you are fever-free without the help of medications (Tylenol/acetaminophen and Advil/ibuprofen/Motrin) AND your symptoms are improving.  You can alternate Tylenol/acetaminophen and Advil/ibuprofen/Motrin every 4 hours for sore throat, body aches, headache or fever.  Drink plenty of water.  Use saline nasal spray for congestion. You can take Tessalon every 8 hours as needed for cough. Wash your hands frequently. Please rest as needed with frequent repositioning and ambulation as tolerated.    If you use a CPAP or BiPAP device for management of obstructive sleep apnea may continue to use it however use it when isolated from other individuals to avoid spread of COVID-19.   If you use a nebulizer administer medication such as albuterol you may continue to use it however only one isolated from other individuals to avoid the spread of COVID-19.  If your symptoms do not improve please follow-up with your primary care provider or urgent care.  Return to the ER for significant shortness of breath, uncontrollable vomiting, severe chest pain, inability to tolerate fluids, changes in mental status such as confusion or other concerning symptoms.

## 2021-03-26 NOTE — ED Notes (Signed)
States was seen at [facility] yesterday for same , feeling with cough and clear congestion since last wed and left hip pain without any injury since last wed also

## 2021-04-02 ENCOUNTER — Other Ambulatory Visit (HOSPITAL_BASED_OUTPATIENT_CLINIC_OR_DEPARTMENT_OTHER): Payer: Self-pay

## 2021-06-09 ENCOUNTER — Other Ambulatory Visit: Payer: Self-pay

## 2021-06-09 ENCOUNTER — Encounter (HOSPITAL_BASED_OUTPATIENT_CLINIC_OR_DEPARTMENT_OTHER): Payer: Self-pay | Admitting: *Deleted

## 2021-06-09 ENCOUNTER — Other Ambulatory Visit (HOSPITAL_BASED_OUTPATIENT_CLINIC_OR_DEPARTMENT_OTHER): Payer: Self-pay

## 2021-06-09 ENCOUNTER — Emergency Department (HOSPITAL_BASED_OUTPATIENT_CLINIC_OR_DEPARTMENT_OTHER)
Admission: EM | Admit: 2021-06-09 | Discharge: 2021-06-09 | Disposition: A | Discharge status: Home / Self Care | Payer: 59 | Attending: Emergency Medicine | Admitting: Emergency Medicine

## 2021-06-09 DIAGNOSIS — Z7722 Contact with and (suspected) exposure to environmental tobacco smoke (acute) (chronic): Secondary | ICD-10-CM | POA: Insufficient documentation

## 2021-06-09 DIAGNOSIS — M79642 Pain in left hand: Secondary | ICD-10-CM | POA: Diagnosis present

## 2021-06-09 MED ORDER — DICLOFENAC SODIUM 50 MG PO TBEC
50.0000 mg | DELAYED_RELEASE_TABLET | Freq: Two times a day (BID) | ORAL | 0 refills | Status: AC
  Filled 2021-06-09: qty 20, 10d supply, fill #0

## 2021-06-09 NOTE — Discharge Instructions (Addendum)
Wear splint.  Take diclofenac.  Follow-up with your orthopedist as scheduled.

## 2021-06-09 NOTE — ED Provider Notes (Signed)
[facility] Provider Note   CSN: [id number] Arrival date & time: 06/09/21  1136     History Chief Complaint  Patient presents with   Hand Pain    Gina Mason is a 50 y.o. female.  50 year old female presents with complaint of pain in her left hand, specifically along her thumb and index finger.  Patient states that she is been recently diagnosed with carpal tunnel syndrome and has been told she would need surgery however is not scheduled yet.  Patient has tried meloxicam without relief, not currently taking.  Is trying gabapentin at night without improvement and wearing a glove on her left hand.  Denies recent falls or injuries or change in her chronic pain pattern.  No other complaints or concerns.      History reviewed. No pertinent past medical history.  Patient Active Problem List   Diagnosis Date Noted   Fibroid uterus 08/27/2019    Past Surgical History:  Procedure Laterality Date   ANKLE FRACTURE SURGERY     CHOLECYSTECTOMY     TUBAL LIGATION       OB History   No obstetric history on file.     No family history on file.  Social History   Tobacco Use   Smoking status: Never    Passive exposure: Yes   Smokeless tobacco: Never  Vaping Use   Vaping Use: Never used  Substance Use Topics   Alcohol use: Yes    Alcohol/week: 2.0 standard drinks    Types: 2 Cans of beer per week   Drug use: No    Home Medications Prior to Admission medications   Medication Sig Start Date End Date Taking? Authorizing Provider  diclofenac (VOLTAREN) 50 MG EC tablet Take 1 tablet (50 mg total) by mouth 2 (two) times daily for 10 days. 06/09/21 06/19/21 Yes Tacy Learn, PA-C  gabapentin (NEURONTIN) 300 MG capsule Take I capsule at bedtime 03/23/21  Yes [provider]  Lidocaine (HM LIDOCAINE PATCH) 4 % PTCH Apply 1 patch topically every 12 (twelve) hours as needed. 03/26/21  Yes Loni Beckwith, PA-C  benzonatate (TESSALON) 100  MG capsule Take 1 capsule (100 mg total) by mouth every 8 (eight) hours as needed for cough. 03/26/21   Loni Beckwith, PA-C  carisoprodol (SOMA) 350 MG tablet Take 1 tablet (350 mg total) by mouth 3 (three) times daily. 04/28/19   Julianne Rice, MD  promethazine (PHENERGAN) 25 MG tablet Take 1 tablet (25 mg total) by mouth every 6 (six) hours as needed for nausea or vomiting. 12/10/20   Quintella Reichert, MD    Allergies    Patient has no known allergies.  Review of Systems   Review of Systems  Constitutional:  Negative for fever.  Musculoskeletal:  Positive for arthralgias and joint swelling.  Skin:  Negative for color change, rash and wound.  Allergic/Immunologic: Negative for immunocompromised state.  Neurological:  Positive for numbness. Negative for weakness.  Hematological:  Does not bruise/bleed easily.  Psychiatric/Behavioral:  Negative for confusion.   All other systems reviewed and are negative.  Physical Exam Updated Vital Signs BP 134/63 (BP Location: Right Arm)   Pulse (!) 48   Temp 98.5 F (36.9 C) (Oral)   Resp 18   Ht 5' 5.5" (1.664 m)   Wt 97 kg   LMP 04/09/2019   SpO2 100%   BMI 35.04 kg/m   Physical Exam Vitals and nursing note reviewed.  Constitutional:  General: She is not in acute distress.    Appearance: She is well-developed. She is not diaphoretic.  HENT:     Head: Normocephalic and atraumatic.  Cardiovascular:     Pulses: Normal pulses.  Pulmonary:     Effort: Pulmonary effort is normal.  Musculoskeletal:        General: Tenderness present. No swelling, deformity or signs of injury.     Comments: Positive Tinel's, negative Phalen's.  Tenderness along the left thenar eminence, no muscle wasting.  Brisk capillary refill present, sensation tact.  Pain worse with supination of the wrist.  Skin:    General: Skin is dry.     Findings: No bruising, erythema or rash.  Neurological:     Mental Status: She is alert and oriented to person,  place, and time.     Sensory: No sensory deficit.     Motor: No weakness.  Psychiatric:        Behavior: Behavior normal.    ED Results / Procedures / Treatments   Labs (all labs ordered are listed, but only abnormal results are displayed) Labs Reviewed - No data to display  EKG None  Radiology No results found.  Procedures Procedures   Medications Ordered in ED Medications - No data to display  ED Course  I have reviewed the triage vital signs and the nursing notes.  Pertinent labs & imaging results that were available during my care of the patient were reviewed by me and considered in my medical decision making (see chart for details).  Clinical Course as of 06/09/21 1521  Tue Oct 18, 762  [id number] 50 year old female with complaint of left hand pain, history of carpal tunnel, not scheduled to see Ortho until November.  Exam as above.  No recent falls or injuries.  We will try a splint and diclofenac, advised to discontinue other NSAIDs. [LM]    Clinical Course User Index [LM] Roque Lias   MDM Rules/Calculators/A&P                           Final Clinical Impression(s) / ED Diagnoses Final diagnoses:  Left hand pain    Rx / DC Orders ED Discharge Orders          Ordered    diclofenac (VOLTAREN) 50 MG EC tablet  2 times daily        06/09/21 1517             Tacy Learn, PA-C 06/09/21 1521    Tegeler, Gwenyth Allegra, MD 06/09/21 [phone]

## 2021-06-09 NOTE — ED Triage Notes (Signed)
She has been treated for carpal tunnel by her orthopedist. She is here with pain.

## 2021-07-28 ENCOUNTER — Emergency Department (HOSPITAL_BASED_OUTPATIENT_CLINIC_OR_DEPARTMENT_OTHER)
Admission: EM | Admit: 2021-07-28 | Discharge: 2021-07-28 | Disposition: A | Discharge status: Home / Self Care | Payer: 59 | Attending: Emergency Medicine | Admitting: Emergency Medicine

## 2021-07-28 ENCOUNTER — Other Ambulatory Visit: Payer: Self-pay

## 2021-07-28 ENCOUNTER — Encounter (HOSPITAL_BASED_OUTPATIENT_CLINIC_OR_DEPARTMENT_OTHER): Payer: Self-pay

## 2021-07-28 ENCOUNTER — Emergency Department (HOSPITAL_BASED_OUTPATIENT_CLINIC_OR_DEPARTMENT_OTHER): Payer: 59

## 2021-07-28 DIAGNOSIS — J101 Influenza due to other identified influenza virus with other respiratory manifestations: Secondary | ICD-10-CM | POA: Insufficient documentation

## 2021-07-28 DIAGNOSIS — J069 Acute upper respiratory infection, unspecified: Secondary | ICD-10-CM | POA: Diagnosis not present

## 2021-07-28 DIAGNOSIS — Z20822 Contact with and (suspected) exposure to covid-19: Secondary | ICD-10-CM | POA: Diagnosis not present

## 2021-07-28 DIAGNOSIS — R000 Tachycardia, unspecified: Secondary | ICD-10-CM | POA: Insufficient documentation

## 2021-07-28 DIAGNOSIS — J111 Influenza due to unidentified influenza virus with other respiratory manifestations: Secondary | ICD-10-CM

## 2021-07-28 DIAGNOSIS — Z8616 Personal history of COVID-19: Secondary | ICD-10-CM | POA: Insufficient documentation

## 2021-07-28 DIAGNOSIS — R519 Headache, unspecified: Secondary | ICD-10-CM | POA: Diagnosis present

## 2021-07-28 LAB — RESP PANEL BY RT-PCR (FLU A&B, COVID) ARPGX2
Influenza A by PCR: POSITIVE — AB
Influenza B by PCR: NEGATIVE
SARS Coronavirus 2 by RT PCR: NEGATIVE

## 2021-07-28 LAB — GROUP A STREP BY PCR: Group A Strep by PCR: NOT DETECTED

## 2021-07-28 MED ORDER — IBUPROFEN 400 MG PO TABS
600.0000 mg | ORAL_TABLET | Freq: Once | ORAL | Status: AC
  Administered 2021-07-28: 600 mg via ORAL
  Filled 2021-07-28: qty 1

## 2021-07-28 MED ORDER — OSELTAMIVIR PHOSPHATE 75 MG PO CAPS: 75.0000 mg | ORAL_CAPSULE | Freq: Two times a day (BID) | ORAL | 0 refills | Status: AC

## 2021-07-28 NOTE — ED Provider Notes (Signed)
[facility] Provider Note   CSN: [id number] Arrival date & time: 07/28/21  3235     History Chief Complaint  Patient presents with   URI    Gina Mason is a 50 y.o. female.  She is here with a complaint of sore throat that started 3 days ago.  Associated with headache body aches cough with green sputum.  Feeling chilled.  No sick contacts or recent travel.  She is COVID vaccinated, not flu vaccinated.  Has been eating and drinking okay.  No nausea vomiting diarrhea or urinary symptoms.  The history is provided by the patient.  URI Presenting symptoms: cough, fatigue and sore throat   Severity:  Moderate Onset quality:  Gradual Duration:  3 days Timing:  Constant Progression:  Unchanged Chronicity:  New Relieved by:  None tried Worsened by:  Drinking and eating Ineffective treatments:  None tried Associated symptoms: headaches and myalgias   Headaches:    Severity:  Severe   Onset quality:  Gradual   Duration:  3 days   Timing:  Constant   Progression:  Unchanged   Chronicity:  New Risk factors: no recent travel and no sick contacts       History reviewed. No pertinent past medical history.  Patient Active Problem List   Diagnosis Date Noted   Fibroid uterus 08/27/2019    Past Surgical History:  Procedure Laterality Date   ANKLE FRACTURE SURGERY     CHOLECYSTECTOMY     TUBAL LIGATION       OB History   No obstetric history on file.     History reviewed. No pertinent family history.  Social History   Tobacco Use   Smoking status: Never    Passive exposure: Yes   Smokeless tobacco: Never  Vaping Use   Vaping Use: Never used  Substance Use Topics   Alcohol use: Yes    Alcohol/week: 2.0 standard drinks    Types: 2 Cans of beer per week   Drug use: No    Home Medications Prior to Admission medications   Medication Sig Start Date End Date Taking? Authorizing Provider  benzonatate (TESSALON) 100 MG capsule Take 1  capsule (100 mg total) by mouth every 8 (eight) hours as needed for cough. 03/26/21   Loni Beckwith, PA-C  carisoprodol (SOMA) 350 MG tablet Take 1 tablet (350 mg total) by mouth 3 (three) times daily. 04/28/19   Julianne Rice, MD  gabapentin (NEURONTIN) 300 MG capsule Take I capsule at bedtime 03/23/21   [provider]  Lidocaine (HM LIDOCAINE PATCH) 4 % PTCH Apply 1 patch topically every 12 (twelve) hours as needed. 03/26/21   Loni Beckwith, PA-C  promethazine (PHENERGAN) 25 MG tablet Take 1 tablet (25 mg total) by mouth every 6 (six) hours as needed for nausea or vomiting. 12/10/20   Quintella Reichert, MD    Allergies    Patient has no known allergies.  Review of Systems   Review of Systems  Constitutional:  Positive for fatigue.  HENT:  Positive for sore throat.   Eyes:  Negative for visual disturbance.  Respiratory:  Positive for cough.   Cardiovascular:  Negative for chest pain.  Gastrointestinal:  Negative for diarrhea, nausea and vomiting.  Genitourinary:  Negative for dysuria.  Musculoskeletal:  Positive for myalgias.  Skin:  Negative for rash.  Neurological:  Positive for headaches.   Physical Exam Updated Vital Signs BP (!) 155/84 (BP Location: Right Arm)   Pulse 67  Temp 98.4 F (36.9 C) (Oral)   Resp 18   Ht 5\' 5"  (1.651 m)   Wt 94.3 kg   LMP 04/09/2019   SpO2 100%   BMI 34.61 kg/m   Physical Exam Vitals and nursing note reviewed.  Constitutional:      General: She is not in acute distress.    Appearance: Normal appearance. She is well-developed.  HENT:     Head: Normocephalic and atraumatic.     Mouth/Throat:     Mouth: Mucous membranes are moist.     Pharynx: Posterior oropharyngeal erythema present. No oropharyngeal exudate.  Eyes:     Conjunctiva/sclera: Conjunctivae normal.  Cardiovascular:     Rate and Rhythm: Regular rhythm. Tachycardia present.     Heart sounds: No murmur heard. Pulmonary:     Effort: Pulmonary effort is  normal. No respiratory distress.     Breath sounds: Normal breath sounds.  Abdominal:     Palpations: Abdomen is soft.     Tenderness: There is no abdominal tenderness.  Musculoskeletal:        General: No swelling, deformity or signs of injury.     Cervical back: Neck supple.  Skin:    General: Skin is warm and dry.     Capillary Refill: Capillary refill takes less than 2 seconds.  Neurological:     General: No focal deficit present.     Mental Status: She is alert.     Gait: Gait normal.  Psychiatric:        Mood and Affect: Mood normal.    ED Results / Procedures / Treatments   Labs (all labs ordered are listed, but only abnormal results are displayed) Labs Reviewed  RESP PANEL BY RT-PCR (FLU A&B, COVID) ARPGX2 - Abnormal; Notable for the following components:      Result Value   Influenza A by PCR POSITIVE (*)    All other components within normal limits  GROUP A STREP BY PCR    EKG None  Radiology DG Chest Port 1 View  Result Date: 07/28/2021 CLINICAL DATA:  Cough, sore throat, chills EXAM: PORTABLE CHEST 1 VIEW COMPARISON:  Radiograph 03/26/2021, chest CT 01/13/2021 FINDINGS: Unchanged cardiomediastinal silhouette. No focal airspace consolidation. No large pleural effusion. No visible pneumothorax. As a gas fissure noted. No acute osseous abnormality. IMPRESSION: No evidence of acute cardiopulmonary disease. Electronically Signed   By: Maurine Simmering M.D.   On: 07/28/2021 08:46    Procedures Procedures   Medications Ordered in ED Medications  ibuprofen (ADVIL) tablet 600 mg (has no administration in time range)    ED Course  I have reviewed the triage vital signs and the nursing notes.  Pertinent labs & imaging results that were available during my care of the patient were reviewed by me and considered in my medical decision making (see chart for details).  Clinical Course as of 07/28/21 1749  Tue Jul 28, 2021  9211 Chest x-ray interpreted by me as no acute  infiltrates.  Awaiting radiology reading. [MB]    Clinical Course User Index [MB] Hayden Rasmussen, MD   MDM Rules/Calculators/A&P                          Duane Trias was evaluated in Emergency Department on 07/28/2021 for the symptoms described in the history of present illness. She was evaluated in the context of the global COVID-19 pandemic, which necessitated consideration that the patient might be at risk for  infection with the SARS-CoV-2 virus that causes COVID-19. Institutional protocols and algorithms that pertain to the evaluation of patients at risk for COVID-19 are in a state of rapid change based on information released by regulatory bodies including the CDC and federal and state organizations. These policies and algorithms were followed during the patient's care in the ED. Differential diagnosis includes COVID, flu, pneumonia, bronchitis, viral syndrome.  Patient ultimately tested positive for flu and chest x-ray does not show any acute infiltrates.  We discussed pros and cons of Tamiflu and she elects to trial it.  Recommended other symptomatic treatment.  Return instructions discussed  Final Clinical Impression(s) / ED Diagnoses Final diagnoses:  Influenza    Rx / DC Orders ED Discharge Orders          Ordered    oseltamivir (TAMIFLU) 75 MG capsule  Every 12 hours        07/28/21 0910             Hayden Rasmussen, MD 07/28/21 1750

## 2021-07-28 NOTE — Discharge Instructions (Signed)
You are seen in the emergency department for headache, sore throat.  You tested positive for flu.  Your COVID test was negative.  You should use Tylenol and ibuprofen, fluids and rest.  Tamiflu has been prescribed and may help your symptoms.  Follow-up with your regular doctor.  Return if any worsening or concerning symptoms

## 2021-07-28 NOTE — ED Triage Notes (Signed)
Pt c/o headache, sore throat, chills, body aches, cough, runny nose since yesterday.

## 2021-12-23 ENCOUNTER — Emergency Department (HOSPITAL_BASED_OUTPATIENT_CLINIC_OR_DEPARTMENT_OTHER)
Admission: EM | Admit: 2021-12-23 | Discharge: 2021-12-23 | Disposition: A | Discharge status: Home / Self Care | Payer: 59 | Attending: Emergency Medicine | Admitting: Emergency Medicine

## 2021-12-23 ENCOUNTER — Emergency Department (HOSPITAL_BASED_OUTPATIENT_CLINIC_OR_DEPARTMENT_OTHER): Payer: 59

## 2021-12-23 ENCOUNTER — Encounter (HOSPITAL_BASED_OUTPATIENT_CLINIC_OR_DEPARTMENT_OTHER): Payer: Self-pay

## 2021-12-23 DIAGNOSIS — M25532 Pain in left wrist: Secondary | ICD-10-CM | POA: Insufficient documentation

## 2021-12-23 NOTE — ED Triage Notes (Signed)
C/o pain & swelling to left wrist. Hx of carpal tunnel surgery a few months ago. ?

## 2021-12-23 NOTE — Discharge Instructions (Signed)
As we discussed, your work-up in the ER today was reassuring for acute abnormalities.  X-ray imaging of your wrist was negative for any fracture or dislocation.  I suspect that your pain is related to the overuse injury.  I recommend rest, ice, compression with bracing, and elevation with Tylenol/ibuprofen as needed for pain. You may also use topical Voltaren cream. ? ?Return if development of any new or worsening symptoms. ?

## 2021-12-23 NOTE — ED Provider Notes (Signed)
?[facility] ?Provider Note ? ? ?CSN: [id number] ?Arrival date & time: 12/23/21  1024 ? ?  ? ?History ? ?Chief Complaint  ?Patient presents with  ? Wrist Pain  ? ? ?Marco Bradburn is a 51 y.o. female. ? ?Patient with history of carpal tunnel syndrome presents today with complaints of left wrist pain. She states that same began on 4/25 and has been consistent since then. 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 States that she had surgery for her carpal tunnel in February and has been wearing her brace since then. Bracing does not help her pain. She states that this pain is different and is located to the back of her wrist and is worsened with movement. Denies any fevers or chills. No known injury. ? ?The history is provided by the patient. No language interpreter was used.  ?Wrist Pain ? ? ?  ? ?Home Medications ?Prior to Admission medications   ?Medication Sig Start Date End Date Taking? Authorizing Provider  ?benzonatate (TESSALON) 100 MG capsule Take 1 capsule (100 mg total) by mouth every 8 (eight) hours as needed for cough. 03/26/21   Loni Beckwith, PA-C  ?carisoprodol (SOMA) 350 MG tablet Take 1 tablet (350 mg total) by mouth 3 (three) times daily. 04/28/19   Julianne Rice, MD  ?gabapentin (NEURONTIN) 300 MG capsule Take I capsule at bedtime 03/23/21   [provider]  ?Lidocaine (HM LIDOCAINE PATCH) 4 % PTCH Apply 1 patch topically every 12 (twelve) hours as needed. 03/26/21   Loni Beckwith, PA-C  ?oseltamivir (TAMIFLU) 75 MG capsule Take 1 capsule (75 mg total) by mouth every 12 (twelve) hours. 07/28/21   Hayden Rasmussen, MD  ?promethazine (PHENERGAN) 25 MG tablet Take 1 tablet (25 mg total) by mouth every 6 (six) hours as needed for nausea or vomiting. 12/10/20   Quintella Reichert, MD  ?   ? ?Allergies    ?Patient has no known allergies.   ? ?Review of Systems   ?Review of Systems  ?Constitutional:  Negative for chills and fever.   ?Musculoskeletal:  Positive for arthralgias.  ?All other systems reviewed and are negative. ? ?Physical Exam ?Updated Vital Signs ?BP 120/78 (BP Location: Right Arm)   Pulse 75   Temp 98.4 ?F (36.9 ?C) (Oral)   Resp 18   Ht '5\' 5"'$  (1.651 m)   Wt 101.2 kg   LMP 04/09/2019   SpO2 100%   BMI 37.11 kg/m?  ?Physical Exam ?Vitals and nursing note reviewed.  ?Constitutional:   ?   General: She is not in acute distress. ?   Appearance: Normal appearance. She is normal weight. She is not ill-appearing, toxic-appearing or diaphoretic.  ?HENT:  ?   Head: Normocephalic and atraumatic.  ?Cardiovascular:  ?   Rate and Rhythm: Normal rate.  ?Pulmonary:  ?   Effort: Pulmonary effort is normal. No respiratory distress.  ?Musculoskeletal:     ?   General: Normal range of motion.  ?   Cervical back: Normal range of motion.  ?   Comments: Full ROM intact to the left wrist with some pain to flexion. Mild swelling noted to the posterior left wrist without erythema or warmth. Radial and ulnar pulses intact and 2+. Capillary refill less than 2 seconds  ?Skin: ?   General: Skin is warm and dry.  ?Neurological:  ?   General: No focal deficit present.  ?   Mental Status: She is  alert.  ?Psychiatric:     ?   Mood and Affect: Mood normal.     ?   Behavior: Behavior normal.  ? ? ?ED Results / Procedures / Treatments   ?Labs ?(all labs ordered are listed, but only abnormal results are displayed) ?Labs Reviewed - No data to display ? ?EKG ?None ? ?Radiology ?DG Wrist Complete Left ? ?Result Date: 12/23/2021 ?CLINICAL DATA:  LEFT wrist pain. EXAM: LEFT WRIST - COMPLETE 3+ VIEW COMPARISON:  August 10, 2021. FINDINGS: No sign of acute fracture or dislocation. No substantial soft tissue swelling. No radiopaque foreign body. Degenerative changes are noted about the wrist as before. 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 No acute findings  with degenerative changes as before. Electronically Signed   By: Zetta Bills M.D.   On: 12/23/2021 11:36   ? ?Procedures ?Procedures  ? ? ?Medications Ordered in ED ?Medications - No data to display ? ?ED Course/ Medical Decision Making/ A&P ?  ?                        ?Medical Decision Making ?Amount and/or Complexity of Data Reviewed ?Radiology: ordered. ? ? ?Patient presents today with 1 week of left wrist pain. She is afebrile, nontoxic-appearing, and in no acute distress reassuring vital signs.  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  I have personally visualized this imaging and agree with radiology interpretation.  I discussed the findings with the patient who states that the bony abnormality visualized has been present for many years.  Patient does have some swelling over the posterior left wrist without erythema or warmth. No concern for septic arthritis or gout. Suspect pain is from overuse injury. Discussed with patient who is in agreement. Pt has appointment with her doctor on Monday, advised patient to discuss this at appointment if it is still bothering her at that time. Offered brace for support which patient declined, states that she already has brace at home. Recommended RICE and tylenol/ibuprofen for pain.  Patient is understanding and amenable with plan, educated on red flag symptoms of prompt immediate return.  Discharged stable condition.  ? ? ?Final Clinical Impression(s) / ED Diagnoses ?Final diagnoses:  ?Left wrist pain  ? ? ?Rx / DC Orders ?ED Discharge Orders   ? ? None  ? ?  ?An After Visit Summary was printed and given to the patient. ? ? ?  ?Bud Face, PA-C ?12/23/21 1245 ? ?  ?Jeanell Sparrow, DO ?12/26/21 1212 ? ?

## 2022-06-20 ENCOUNTER — Other Ambulatory Visit: Payer: Self-pay

## 2022-06-20 ENCOUNTER — Emergency Department (HOSPITAL_BASED_OUTPATIENT_CLINIC_OR_DEPARTMENT_OTHER): Payer: 59

## 2022-06-20 ENCOUNTER — Emergency Department (HOSPITAL_BASED_OUTPATIENT_CLINIC_OR_DEPARTMENT_OTHER)
Admission: EM | Admit: 2022-06-20 | Discharge: 2022-06-20 | Disposition: A | Discharge status: Home / Self Care | Payer: 59 | Attending: Emergency Medicine | Admitting: Emergency Medicine

## 2022-06-20 ENCOUNTER — Encounter (HOSPITAL_BASED_OUTPATIENT_CLINIC_OR_DEPARTMENT_OTHER): Payer: Self-pay | Admitting: Emergency Medicine

## 2022-06-20 DIAGNOSIS — M25511 Pain in right shoulder: Secondary | ICD-10-CM

## 2022-06-20 DIAGNOSIS — M25561 Pain in right knee: Secondary | ICD-10-CM | POA: Diagnosis not present

## 2022-06-20 NOTE — ED Triage Notes (Signed)
Pt reports RT shoulder pain x 4 wks, no injury; also c/o RT knee pain x 2 wks, no injury

## 2022-06-20 NOTE — ED Provider Notes (Signed)
[facility] Provider Note   CSN: [id number] Arrival date & time: 06/20/22  1056     History  Chief Complaint  Patient presents with   Shoulder Pain    Gina Mason is a 51 y.o. female who presents emergency department with concerns for right shoulder pain onset 1 month.  Denies recent injury, trauma, fall prior to the onset of her symptoms.  Also complaining of right knee pain x2 weeks again without injury, trauma, fall.  Has not tried any medications for his symptoms at home.  Denies swelling, color change.  The history is provided by the patient. No language interpreter was used.       Home Medications Prior to Admission medications   Medication Sig Start Date End Date Taking? Authorizing Provider  benzonatate (TESSALON) 100 MG capsule Take 1 capsule (100 mg total) by mouth every 8 (eight) hours as needed for cough. 03/26/21   Loni Beckwith, PA-C  carisoprodol (SOMA) 350 MG tablet Take 1 tablet (350 mg total) by mouth 3 (three) times daily. 04/28/19   Julianne Rice, MD  gabapentin (NEURONTIN) 300 MG capsule Take I capsule at bedtime 03/23/21   [provider]  Lidocaine (HM LIDOCAINE PATCH) 4 % PTCH Apply 1 patch topically every 12 (twelve) hours as needed. 03/26/21   Loni Beckwith, PA-C  oseltamivir (TAMIFLU) 75 MG capsule Take 1 capsule (75 mg total) by mouth every 12 (twelve) hours. 07/28/21   Hayden Rasmussen, MD  promethazine (PHENERGAN) 25 MG tablet Take 1 tablet (25 mg total) by mouth every 6 (six) hours as needed for nausea or vomiting. 12/10/20   Quintella Reichert, MD      Allergies    Patient has no known allergies.    Review of Systems   Review of Systems  All other systems reviewed and are negative.   Physical Exam Updated Vital Signs BP (!) 140/78   Pulse (!) 51   Temp 98.2 F (36.8 C) (Oral)   Resp 16   Ht '5\' 6"'$  (1.676 m)   LMP 04/09/2019   SpO2 100%   BMI 35.99 kg/m  Physical Exam Vitals and nursing  note reviewed.  Constitutional:      General: She is not in acute distress.    Appearance: Normal appearance.  Eyes:     General: No scleral icterus.    Extraocular Movements: Extraocular movements intact.  Cardiovascular:     Rate and Rhythm: Normal rate.  Pulmonary:     Effort: Pulmonary effort is normal. No respiratory distress.  Musculoskeletal:     Cervical back: Neck supple.     Comments: Tenderness to palpation to anterior right shoulder without overlying skin changes.  Able to supinate and pronate right elbow without difficulty.  No spinal tenderness to palpation.  No tenderness to palpation noted to the right trapezius.  Mild tenderness to palpation noted to medial inferior aspect of right knee without obvious swelling, erythema. Able to ambulate without assistance or difficulty.   Skin:    General: Skin is warm and dry.     Findings: No bruising, erythema or rash.  Neurological:     Mental Status: She is alert.  Psychiatric:        Behavior: Behavior normal.     ED Results / Procedures / Treatments   Labs (all labs ordered are listed, but only abnormal results are displayed) Labs Reviewed - No data to display  EKG None  Radiology DG Knee Complete 4 Views  Right  Result Date: 06/20/2022 CLINICAL DATA:  51 year old female with right shoulder pain for 4 weeks. Right knee pain for 2 weeks. No known injury. EXAM: RIGHT KNEE - COMPLETE 4+ VIEW COMPARISON:  None Available. FINDINGS: Bone mineralization is within normal limits. No evidence of fracture, dislocation, or joint effusion. No evidence of arthropathy or other focal bone abnormality. Soft tissues are unremarkable. IMPRESSION: Negative. Electronically Signed   By: Genevie Ann M.D.   On: 06/20/2022 11:53   DG Shoulder Right  Result Date: 06/20/2022 CLINICAL DATA:  51 year old female with right shoulder pain for 4 weeks. Right knee pain for 2 weeks. No known injury. EXAM: RIGHT SHOULDER - 2+ VIEW COMPARISON:  Chest  radiographs 07/28/2021 and earlier. FINDINGS: Bone mineralization is within normal limits. There is no evidence of fracture or dislocation. There is no evidence of arthropathy or other focal bone abnormality. Negative visible right ribs and chest. IMPRESSION: Negative. Electronically Signed   By: Genevie Ann M.D.   On: 06/20/2022 11:53    Procedures Procedures    Medications Ordered in ED Medications - No data to display  ED Course/ Medical Decision Making/ A&P                           Medical Decision Making Amount and/or Complexity of Data Reviewed Radiology: ordered.   Patient with right shoulder pain onset 1 month.  Also with right knee pain onset 2 weeks.  No recent injury, trauma, fall.  Patient works as a Scientist, product/process development.  Patient afebrile. On exam, patient with Tenderness to palpation to anterior right shoulder without overlying skin changes.  Able to supinate and pronate right elbow without difficulty.  No spinal tenderness to palpation.  No tenderness to palpation noted to the right trapezius.  Mild tenderness to palpation noted to medial inferior aspect of right knee without obvious swelling, erythema. Able to ambulate without assistance or difficulty.  Differential diagnosis includes fracture, dislocation, strain/sprain, rotator cuff pathology.   Imaging: I ordered imaging studies including right shoulder x-ray, right knee x-ray I independently visualized and interpreted imaging which showed: No acute findings I agree with the radiologist interpretation   Disposition: Presenting suspicious for acute strain of right shoulder and knee.  Doubt fracture, dislocation at this time.  Doubt concerns at this time for septic arthritis, patient with no overlying skin changes. After consideration of the diagnostic results and the patients response to treatment, I feel that the patient would benefit from Discharge home.  Patient instructed to follow-up with her orthopedist regarding today's ED visit.   Also provided with information for on-call orthopedist.  Work note provided.  Supportive care measures and strict return precautions discussed with patient at bedside. Pt acknowledges and verbalizes understanding. Pt appears safe for discharge. Follow up as indicated in discharge paperwork.    This chart was dictated using voice recognition software, Dragon. Despite the best efforts of this provider to proofread and correct errors, errors may still occur which can change documentation meaning.  Final Clinical Impression(s) / ED Diagnoses Final diagnoses:  Acute pain of right shoulder  Acute pain of right knee    Rx / DC Orders ED Discharge Orders     None         Hailynn Slovacek A, PA-C 06/20/22 1329    Gareth Morgan, MD 06/22/22 [phone]

## 2022-06-20 NOTE — Discharge Instructions (Addendum)
It was a pleasure taking care of you!   Your x-ray was negative for fracture or dislocation.  You may take over the counter 600 mg Ibuprofen every 6 hours or 500 mg Tylenol every 6 hours as needed for pain for no more than 7 days.  Call your orthopedist office tomorrow and set up a follow-up appointment with the orthopedist.  Attached is information for the on-call orthopedic specialist for [facility], you may call and set up a follow-up appointment as needed.  You may apply ice to affected area for up to 15 minutes at a time. Ensure to place a barrier between your skin and the ice.  You may follow-up with your primary care provider as needed.  Return to the Emergency Department if you are experiencing increasing/worsening symptoms.

## 2022-06-20 NOTE — ED Notes (Signed)
Pt discharged to home. Discharge instructions have been discussed with patient and/or family members. Pt verbally acknowledges understanding d/c instructions, and endorses comprehension to checkout at registration before leaving.  °

## 2022-08-20 ENCOUNTER — Other Ambulatory Visit: Payer: Self-pay

## 2022-08-20 ENCOUNTER — Encounter (HOSPITAL_BASED_OUTPATIENT_CLINIC_OR_DEPARTMENT_OTHER): Payer: Self-pay | Admitting: Emergency Medicine

## 2022-08-20 ENCOUNTER — Emergency Department (HOSPITAL_BASED_OUTPATIENT_CLINIC_OR_DEPARTMENT_OTHER)
Admission: EM | Admit: 2022-08-20 | Discharge: 2022-08-20 | Disposition: A | Discharge status: Home / Self Care | Payer: 59 | Attending: Emergency Medicine | Admitting: Emergency Medicine

## 2022-08-20 DIAGNOSIS — U071 COVID-19: Secondary | ICD-10-CM | POA: Diagnosis not present

## 2022-08-20 DIAGNOSIS — R059 Cough, unspecified: Secondary | ICD-10-CM | POA: Diagnosis present

## 2022-08-20 LAB — RESP PANEL BY RT-PCR (RSV, FLU A&B, COVID)  RVPGX2
Influenza A by PCR: NEGATIVE
Influenza B by PCR: NEGATIVE
Resp Syncytial Virus by PCR: NEGATIVE
SARS Coronavirus 2 by RT PCR: POSITIVE — AB

## 2022-08-20 MED ORDER — ACETAMINOPHEN 325 MG PO TABS
650.0000 mg | ORAL_TABLET | Freq: Once | ORAL | Status: AC
  Administered 2022-08-20: 650 mg via ORAL
  Filled 2022-08-20: qty 2

## 2022-08-20 MED ORDER — NIRMATRELVIR/RITONAVIR (PAXLOVID)TABLET: 3.0000 | ORAL_TABLET | Freq: Two times a day (BID) | ORAL | 0 refills | Status: AC

## 2022-08-20 NOTE — Discharge Instructions (Addendum)
You were seen in the emergency department for your cough and congestion.  You tested positive for COVID-19.  You have given you Paxlovid which is an antiviral medication to treat this.  You can continue to take your over-the-counter cold and flu medicines.  These both contain Tylenol so make sure you are not taking greater than 4000 mg of Tylenol per day.  You can also take Motrin as needed for fevers and bodyaches.  You can follow-up with your primary doctor in the next few days to have your symptoms rechecked.  You should return to the emergency department for worsening shortness of breath, chest pain, repetitive vomiting or if you have any other new or concerning symptoms.

## 2022-08-20 NOTE — ED Provider Notes (Signed)
[facility] DEPART[facility] Provider Note   CSN: [id number] Arrival date & time: 08/20/22  0601     History  Chief Complaint  Patient presents with   flu like symtoms     Gina Mason is a 51 y.o. female.  Patient is a 51 year old female with no significant past medical history presenting to the emergency department with 2 days of cough, congestion, fevers and bodyaches.  She states that she was with friends on Wednesday that she thinks were sick.  She denies any nausea, vomiting or diarrhea.  She denies any chest pain or shortness of breath.  She states that she is vaccinated against COVID-19.  The history is provided by the patient.       Home Medications Prior to Admission medications   Medication Sig Start Date End Date Taking? Authorizing Provider  nirmatrelvir/ritonavir (PAXLOVID) 20 x 150 MG & 10 x '100MG'$  TABS Take 3 tablets by mouth 2 (two) times daily for 5 days. Patient GFR is >60. Take nirmatrelvir (150 mg) two tablets twice daily for 5 days and ritonavir (100 mg) one tablet twice daily for 5 days. 08/20/22 08/25/22 Yes Kingsley, Jordan Hawks K, DO  benzonatate (TESSALON) 100 MG capsule Take 1 capsule (100 mg total) by mouth every 8 (eight) hours as needed for cough. 03/26/21   Loni Beckwith, PA-C  carisoprodol (SOMA) 350 MG tablet Take 1 tablet (350 mg total) by mouth 3 (three) times daily. 04/28/19   Julianne Rice, MD  gabapentin (NEURONTIN) 300 MG capsule Take I capsule at bedtime 03/23/21   [provider]  Lidocaine (HM LIDOCAINE PATCH) 4 % PTCH Apply 1 patch topically every 12 (twelve) hours as needed. 03/26/21   Loni Beckwith, PA-C  oseltamivir (TAMIFLU) 75 MG capsule Take 1 capsule (75 mg total) by mouth every 12 (twelve) hours. 07/28/21   Hayden Rasmussen, MD  promethazine (PHENERGAN) 25 MG tablet Take 1 tablet (25 mg total) by mouth every 6 (six) hours as needed for nausea or vomiting. 12/10/20   Quintella Reichert, MD      Allergies     Patient has no known allergies.    Review of Systems   Review of Systems  Physical Exam Updated Vital Signs BP (!) 154/106 (BP Location: Left Arm)   Pulse 64   Temp (!) 101.1 F (38.4 C) (Oral)   Resp 16   Ht '5\' 6"'$  (1.676 m)   Wt 96.6 kg   LMP 04/09/2019   SpO2 99%   BMI 34.38 kg/m  Physical Exam Vitals and nursing note reviewed.  Constitutional:      General: She is not in acute distress.    Appearance: Normal appearance.  HENT:     Head: Normocephalic and atraumatic.     Nose: Congestion present.     Mouth/Throat:     Mouth: Mucous membranes are moist.     Pharynx: Oropharynx is clear.  Eyes:     Extraocular Movements: Extraocular movements intact.     Conjunctiva/sclera: Conjunctivae normal.  Cardiovascular:     Rate and Rhythm: Normal rate and regular rhythm.     Heart sounds: Normal heart sounds.  Pulmonary:     Effort: Pulmonary effort is normal.     Breath sounds: Normal breath sounds.  Abdominal:     General: Abdomen is flat.     Palpations: Abdomen is soft.     Tenderness: There is no abdominal tenderness.  Musculoskeletal:        General: Normal  range of motion.     Cervical back: Normal range of motion and neck supple.  Skin:    General: Skin is warm and dry.  Neurological:     General: No focal deficit present.     Mental Status: She is alert and oriented to person, place, and time.  Psychiatric:        Mood and Affect: Mood normal.        Behavior: Behavior normal.     ED Results / Procedures / Treatments   Labs (all labs ordered are listed, but only abnormal results are displayed) Labs Reviewed  RESP PANEL BY RT-PCR (RSV, FLU A&B, COVID)  [id number] - Abnormal; Notable for the following components:      Result Value   SARS Coronavirus 2 by RT PCR POSITIVE (*)    All other components within normal limits    EKG None  Radiology No results found.  Procedures Procedures    Medications Ordered in ED Medications  acetaminophen  (TYLENOL) tablet 650 mg (650 mg Oral Given 08/20/22 3818)    ED Course/ Medical Decision Making/ A&P                           Medical Decision Making This patient presents to the ED with chief complaint(s) of cough and congestion with no pertinent past medical history which further complicates the presenting complaint. The complaint involves an extensive differential diagnosis and also carries with it a high risk of complications and morbidity.    The differential diagnosis includes viral syndrome, no focal breath sounds and satting well on room air making pneumonia unlikely, no evidence of intraoral infection  Additional history obtained: Additional history obtained from N/A Records reviewed N/A  ED Course and Reassessment: Patient was initially evaluated by triage and had viral swab performed and was given Tylenol for her fever.  She tested positive for COVID-19.  Patient will be treated with Paxlovid and was recommended outpatient follow-up.  She was given strict return precautions.  Independent labs interpretation:  The following labs were independently interpreted: COVID-positive  Independent visualization of imaging: N/A  Consultation: - Consulted or discussed management/test interpretation w/ external professional: N/A  Consideration for admission or further workup: Patient has no emergent conditions requiring admission or further work-up at this time and is stable for discharge home with primary care follow-up  Social Determinants of health: N/A            Final Clinical Impression(s) / ED Diagnoses Final diagnoses:  COVID-19    Rx / DC Orders ED Discharge Orders          Ordered    nirmatrelvir/ritonavir (PAXLOVID) 20 x 150 MG & 10 x '100MG'$  TABS  2 times daily        08/20/22 [place], Marshall, DO 08/20/22 [phone]

## 2022-08-20 NOTE — ED Triage Notes (Signed)
Pt is c/o headache, chills, sore throat, and unable to sleep   Sxs started on Wednesday

## 2023-05-04 ENCOUNTER — Encounter (HOSPITAL_BASED_OUTPATIENT_CLINIC_OR_DEPARTMENT_OTHER): Payer: Self-pay

## 2023-05-04 ENCOUNTER — Other Ambulatory Visit: Payer: Self-pay

## 2023-05-04 ENCOUNTER — Emergency Department (HOSPITAL_BASED_OUTPATIENT_CLINIC_OR_DEPARTMENT_OTHER)
Admission: EM | Admit: 2023-05-04 | Discharge: 2023-05-04 | Disposition: A | Discharge status: Home / Self Care | Payer: Medicaid Other | Attending: Emergency Medicine | Admitting: Emergency Medicine

## 2023-05-04 DIAGNOSIS — M25551 Pain in right hip: Secondary | ICD-10-CM | POA: Diagnosis present

## 2023-05-04 MED ORDER — METHOCARBAMOL 500 MG PO TABS: 500.0000 mg | ORAL_TABLET | Freq: Two times a day (BID) | ORAL | 0 refills | Status: AC

## 2023-05-04 MED ORDER — MELOXICAM 7.5 MG PO TABS: 7.5000 mg | ORAL_TABLET | Freq: Every day | ORAL | 1 refills | Status: AC

## 2023-05-04 MED ORDER — KETOROLAC TROMETHAMINE 15 MG/ML IJ SOLN
15.0000 mg | Freq: Once | INTRAMUSCULAR | Status: AC
  Administered 2023-05-04: 15 mg via INTRAMUSCULAR
  Filled 2023-05-04: qty 1

## 2023-05-04 NOTE — ED Triage Notes (Signed)
Pt presents to ED from home C/O R hip pain radiating down RLE X 1 week. Denies known injury.

## 2023-05-04 NOTE — ED Provider Notes (Signed)
[facility] Provider Note   CSN: [id number] Arrival date & time: 05/04/23  1152     History  Chief Complaint  Patient presents with   Hip Pain    Gina Mason is a 52 y.o. female presenting for right-sided hip pain has been ongoing for about a week.  Hip pain is lateral, sometimes the pain radiates down posterior thigh and into upper calf.  Hip only radiates when she has been on her feet for long period of time.  Job requires her to stand for very long periods of time.  Patient denies recent fall injury or trauma.  Patient is not on blood thinner.  Patient reports she has had symptoms like this in the past but they have not been so severe.  Patient has no low back pain.  Patient denies any loss of bowel or bladder, denies any back pain, denies any focal weakness, denies saddle anesthesia, denies pain that is worse at rest, denies fever associated labs pain, no hx of cancer.    Hip Pain       Home Medications Prior to Admission medications   Medication Sig Start Date End Date Taking? Authorizing Provider  benzonatate (TESSALON) 100 MG capsule Take 1 capsule (100 mg total) by mouth every 8 (eight) hours as needed for cough. 03/26/21   Haskel Schroeder, PA-C  carisoprodol (SOMA) 350 MG tablet Take 1 tablet (350 mg total) by mouth 3 (three) times daily. 04/28/19   Loren Racer, MD  gabapentin (NEURONTIN) 300 MG capsule Take I capsule at bedtime 03/23/21   [provider]  Lidocaine (HM LIDOCAINE PATCH) 4 % PTCH Apply 1 patch topically every 12 (twelve) hours as needed. 03/26/21   Haskel Schroeder, PA-C  oseltamivir (TAMIFLU) 75 MG capsule Take 1 capsule (75 mg total) by mouth every 12 (twelve) hours. 07/28/21   Terrilee Files, MD  promethazine (PHENERGAN) 25 MG tablet Take 1 tablet (25 mg total) by mouth every 6 (six) hours as needed for nausea or vomiting. 12/10/20   Tilden Fossa, MD      Allergies    Patient has no known  allergies.    Review of Systems   Review of Systems  Physical Exam Updated Vital Signs BP 139/84 (BP Location: Left Arm)   Pulse (!) 50   Temp 98.5 F (36.9 C) (Oral)   Resp 16   Ht 5\' 6"  (1.676 m)   Wt 94.3 kg   LMP 04/09/2019   SpO2 100%   BMI 33.57 kg/m  Physical Exam Vitals and nursing note reviewed.  Constitutional:      General: She is not in acute distress.    Appearance: She is not toxic-appearing.  HENT:     Head: Normocephalic and atraumatic.  Eyes:     General: No scleral icterus.    Conjunctiva/sclera: Conjunctivae normal.  Cardiovascular:     Rate and Rhythm: Normal rate and regular rhythm.     Pulses: Normal pulses.     Heart sounds: Normal heart sounds.  Pulmonary:     Effort: Pulmonary effort is normal. No respiratory distress.     Breath sounds: Normal breath sounds.  Abdominal:     General: Abdomen is flat. Bowel sounds are normal.     Palpations: Abdomen is soft.     Tenderness: There is no abdominal tenderness.  Musculoskeletal:        General: Normal range of motion.  Skin:    General: Skin  is warm and dry.     Capillary Refill: Capillary refill takes less than 2 seconds.     Findings: No lesion.  Neurological:     General: No focal deficit present.     Mental Status: She is alert and oriented to person, place, and time. Mental status is at baseline.     Cranial Nerves: No cranial nerve deficit.     Sensory: No sensory deficit.     Motor: No weakness.     Gait: Gait normal.     ED Results / Procedures / Treatments   Labs (all labs ordered are listed, but only abnormal results are displayed) Labs Reviewed - No data to display  EKG None  Radiology No results found.  Procedures Procedures    Medications Ordered in ED Medications - No data to display  ED Course/ Medical Decision Making/ A&P                                 Medical Decision Making Risk Prescription drug management.   Gina Mason 52 y.o. presented today  for right sided hip pain. Working Ddx: MSK in nature, fracture, epidural hematoma/abscess, cauda equina syndrome, spinal stenosis, spinal malignancy, discitis, spinal infection, spondylitises/ spondylosis, conus medullaris, DDD of the back.  R/o DDx: Cauda equina syndrome and additional dx are less likely than current impression due to history of present illness, physical exam, labs/imaging findings. No focal neurological deficits, no loss of bowel or bladder control.  Denies fever, night sweats, weight loss, h/o cancer, IVDU.  PMHX: fibroids     Review of prior external notes: chart review   Labs: None  Imaging: None   Problem List / ED Course / Critical interventions / Medication management  Pt presenting with right-sided hip pain has been ongoing for about a week.  Hip pain is lateral, sometimes the pain radiates down posterior thigh and into upper calf.  Hip only radiates when she has been on her feet for long period of time.  Patient does not have any red flag symptoms of back plain including loss of bowel or bladder, night sweats, weight loss.  Patient also did not have recent fall or trauma requiring imaging or findings that suggest fracture or dislocation.  Patient is able to ambulate with only mild discomfort.  Will send NSAIDs for conservative treatment of hip pain as well as Robaxin if patient has breakthrough muscle spasm.  Encourage patient to ice and heat the area. I ordered medication including Torodal  for pain  Reevaluation of the patient after these medicines showed that the patient stayed the same Patients vitals assessed. Upon arrival patient is hemodynamically stable.  I have reviewed the patients home medicines and have made adjustments as needed  Consult: none    Plan: Meloxicam and Robaxin sent F/u w/ PCP in 4-5d to ensure resolution of sx.  RICE protocol and pain medicine discussed with patient.  Patient was given return precautions. Patient stable for discharge  at this time.  Patient educated on sx/dx and verbalized understanding of plan. Return to ER with new or worsening sx.          Final Clinical Impression(s) / ED Diagnoses Final diagnoses:  None    Rx / DC Orders ED Discharge Orders     None         Smitty Knudsen, PA-C 05/04/23 1610    Horton, Clabe Seal, DO 05/05/23 [phone]

## 2023-05-04 NOTE — Discharge Instructions (Addendum)
You were seen in the emergency room for hip pain.  Alternate Tylenol and Meloxicam for pain control. Take Robaxin, as needed for breakthrough pain. I recommend light stretching and alternating ice and heat for pain control.  Please return to ER with new or worsening symptoms. Called PCP to schedule appointment for follow up.

## 2023-05-26 IMAGING — DX DG CHEST 1V PORT
1 series · 1 of 1 positions shown · non-contrast
Comparison: Radiograph 03/26/2021, chest CT 01/13/2021

CLINICAL DATA: Cough, sore throat, chills

EXAM:
PORTABLE CHEST 1 VIEW

[chest ap]
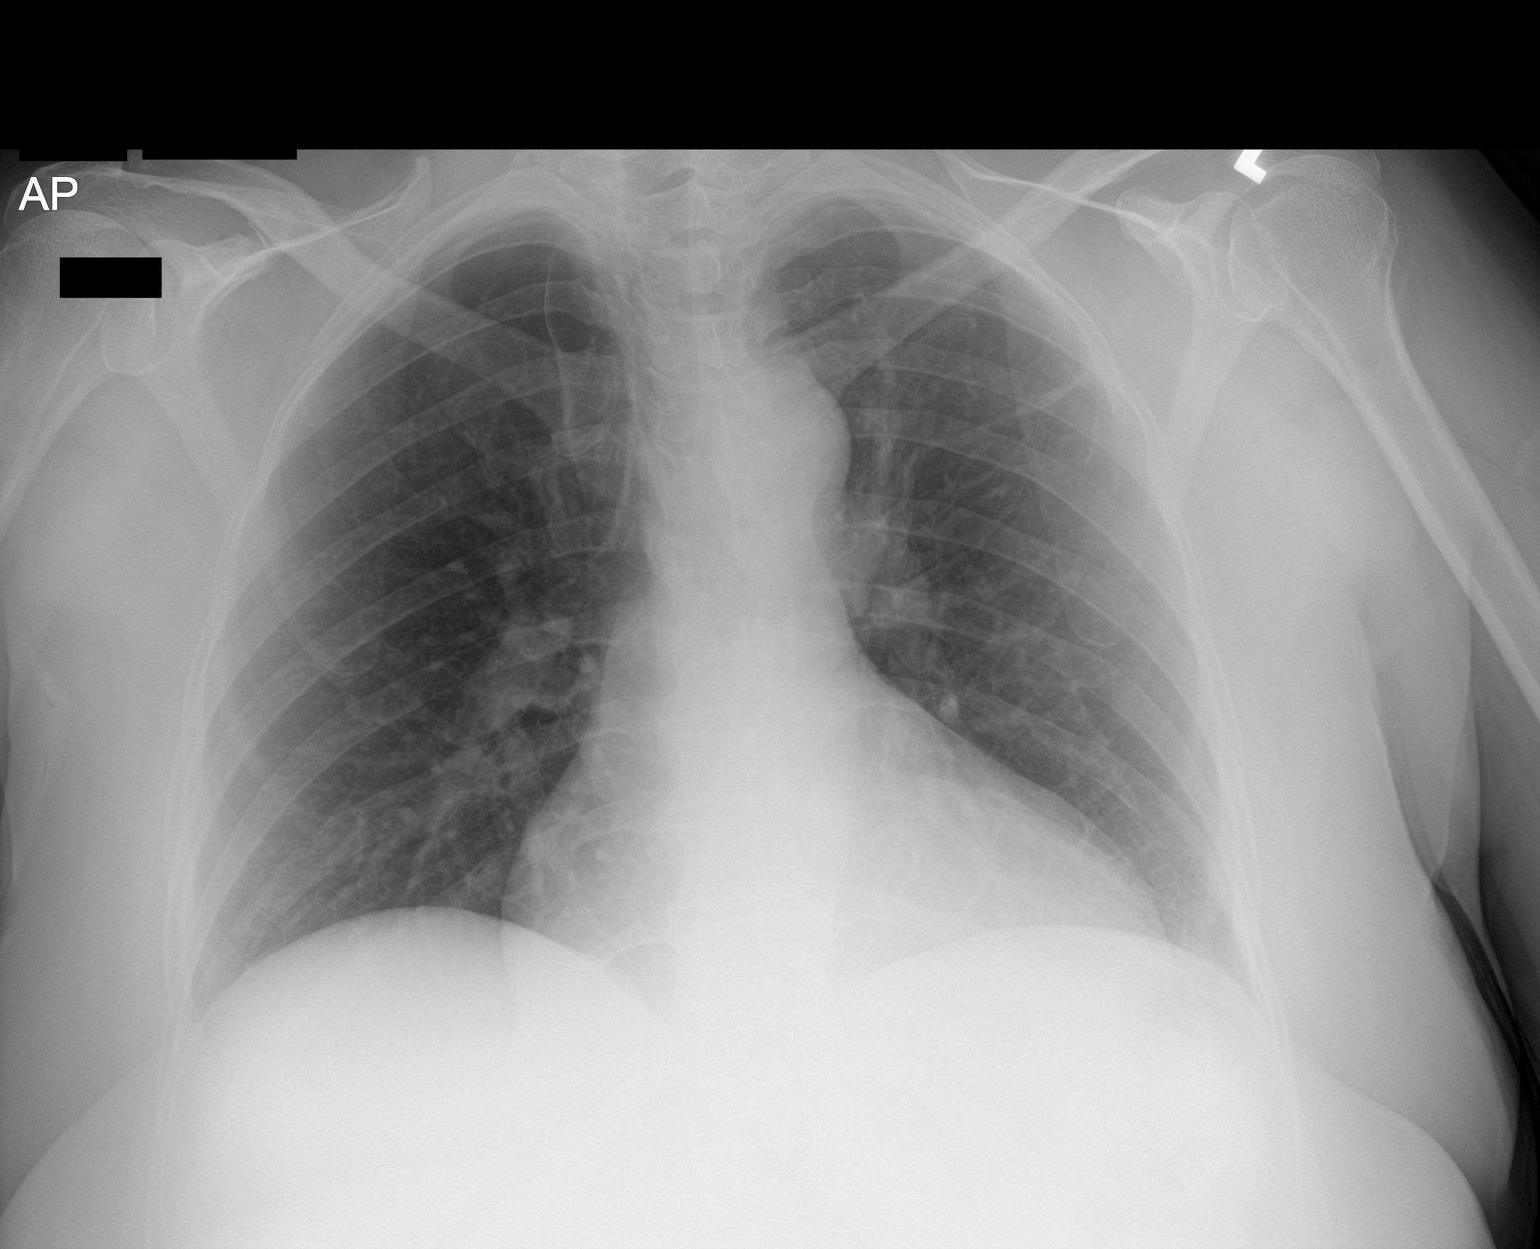

[1 of 1 positions shown; findings below may reference images not displayed]

FINDINGS: Unchanged cardiomediastinal silhouette. No focal airspace
consolidation. No large pleural effusion. No visible pneumothorax.
As a gas fissure noted. No acute osseous abnormality.
IMPRESSION: No evidence of acute cardiopulmonary disease.

## 2023-10-21 IMAGING — DX DG WRIST COMPLETE 3+V*L*
3 series · 3 of 3 positions shown · non-contrast
Comparison: August 10, 2021.

CLINICAL DATA: LEFT wrist pain.

EXAM:
LEFT WRIST - COMPLETE 3+ VIEW

[wrist pa]
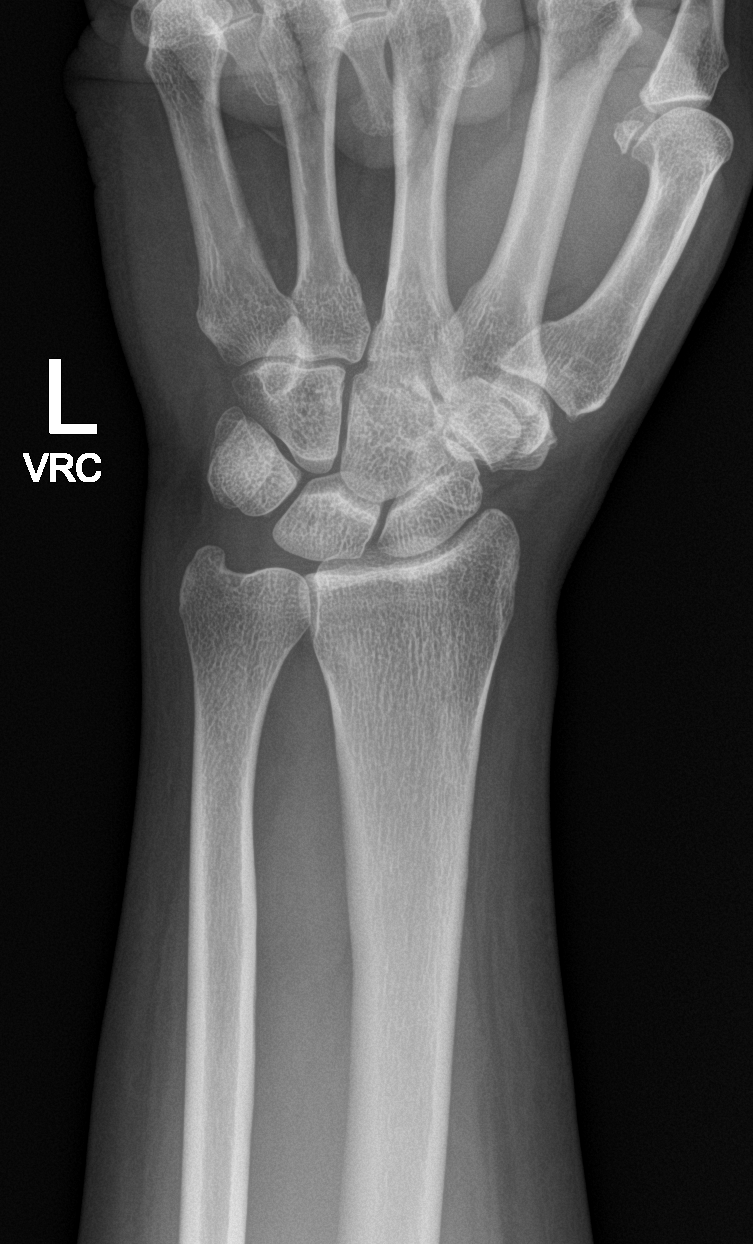

[wrist obl]
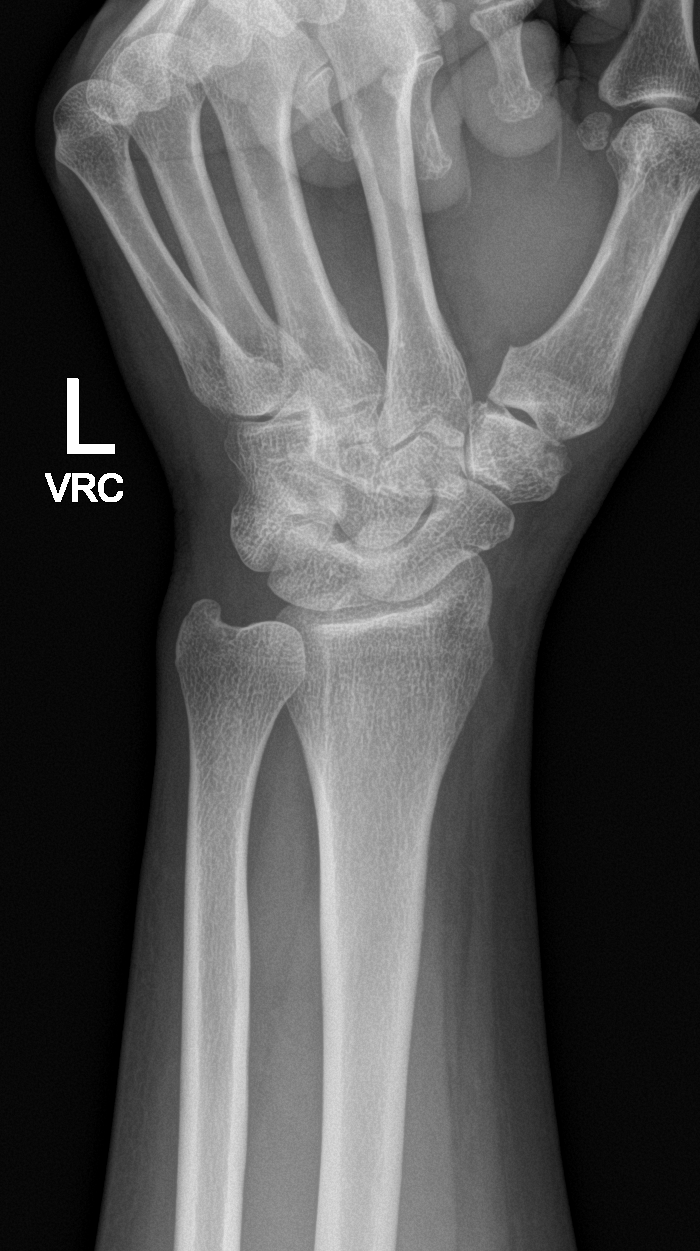

[wrist tunnel]
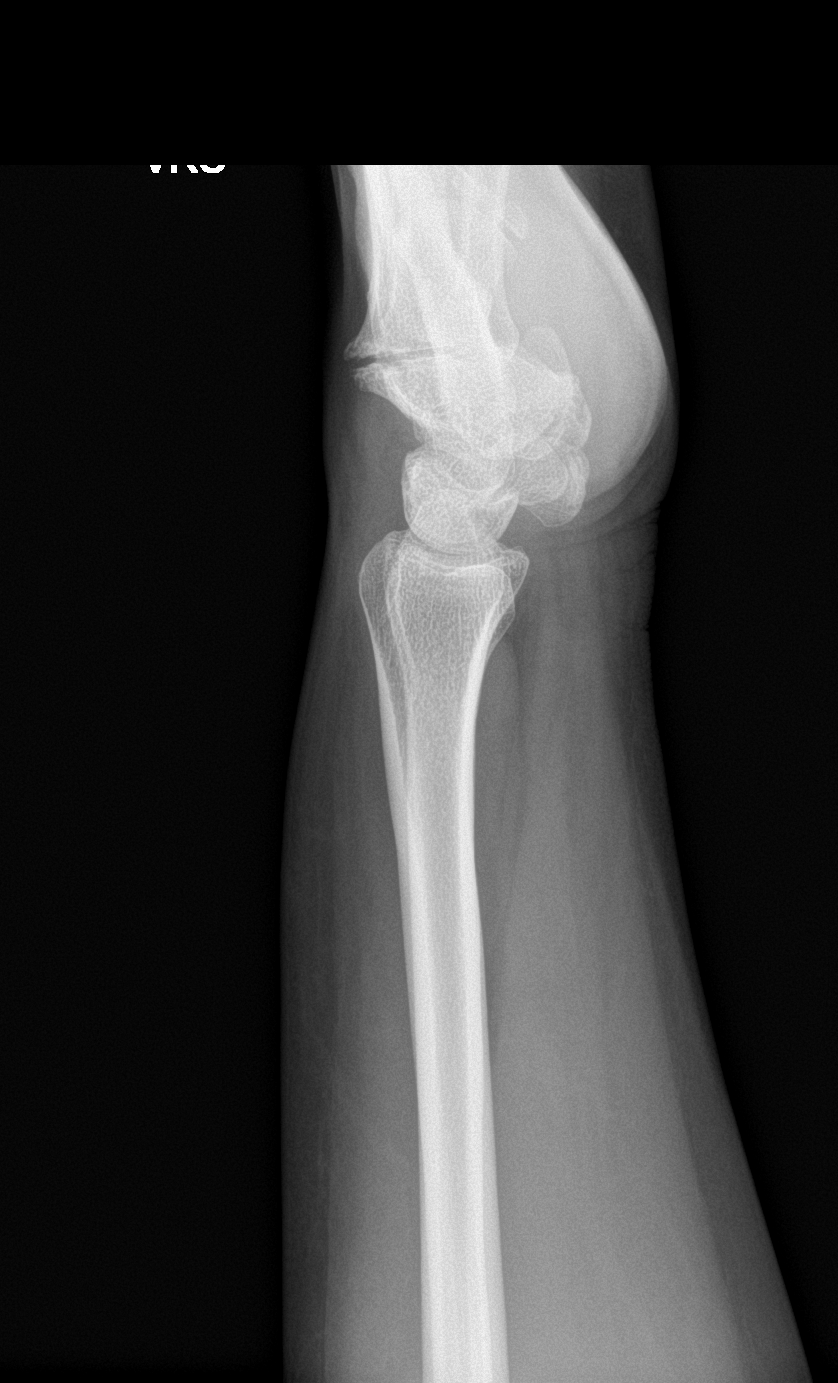

[3 of 3 positions shown; findings below may reference images not displayed]

FINDINGS: No sign of acute fracture or dislocation. No substantial soft tissue
swelling. No radiopaque foreign body. Degenerative changes are noted
about the wrist as before.

There may be capitate to third metacarpal synostosis.
IMPRESSION: Potential bony coalition or fusion between the capitate and third
metacarpal. No acute findings with degenerative changes as before.
